# Patient Record
Sex: Female | Born: 1985 | Race: White | Hispanic: No | Marital: Married | Smoking: Former smoker
Health system: Southern US, Community
[De-identification: ages and names within clinical notes are randomized; demographics above are authoritative.]

## PROBLEM LIST (undated history)

## (undated) DIAGNOSIS — Z9889 Other specified postprocedural states: Secondary | ICD-10-CM

## (undated) DIAGNOSIS — E282 Polycystic ovarian syndrome: Secondary | ICD-10-CM

## (undated) DIAGNOSIS — R112 Nausea with vomiting, unspecified: Secondary | ICD-10-CM

## (undated) DIAGNOSIS — K900 Celiac disease: Secondary | ICD-10-CM

## (undated) HISTORY — PX: OTHER SURGICAL HISTORY: SHX169

## (undated) HISTORY — PX: TONSILLECTOMY: SUR1361

## (undated) HISTORY — PX: LEEP: SHX91

---
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
## 2005-10-31 ENCOUNTER — Other Ambulatory Visit: Admission: RE | Admit: 2005-10-31 | Discharge: 2005-10-31 | Payer: Self-pay | Admitting: Obstetrics and Gynecology

## 2006-08-30 ENCOUNTER — Ambulatory Visit (HOSPITAL_COMMUNITY): Admission: RE | Admit: 2006-08-30 | Discharge: 2006-08-30 | Payer: Self-pay | Admitting: Family Medicine

## 2006-10-18 ENCOUNTER — Ambulatory Visit (HOSPITAL_COMMUNITY): Admission: RE | Admit: 2006-10-18 | Discharge: 2006-10-18 | Payer: Self-pay | Admitting: Family Medicine

## 2007-02-21 ENCOUNTER — Ambulatory Visit (HOSPITAL_COMMUNITY): Admission: RE | Admit: 2007-02-21 | Discharge: 2007-02-21 | Payer: Self-pay | Admitting: Obstetrics and Gynecology

## 2007-02-21 ENCOUNTER — Encounter (INDEPENDENT_AMBULATORY_CARE_PROVIDER_SITE_OTHER): Payer: Self-pay | Admitting: Obstetrics and Gynecology

## 2007-02-27 ENCOUNTER — Encounter: Admission: RE | Admit: 2007-02-27 | Discharge: 2007-02-27 | Payer: Self-pay | Admitting: Obstetrics and Gynecology

## 2010-09-17 ENCOUNTER — Encounter: Payer: Self-pay | Admitting: Family Medicine

## 2011-01-09 NOTE — Op Note (Signed)
[name], Angelica Alvarado               ACCOUNT NO.:  [id number]   MEDICAL RECORD NO.:  [id number]          PATIENT TYPE:  AMB   LOCATION:  SDC                           FACILITY:  [facility]   PHYSICIAN:  Michelle L. Grewal, M.D.DATE OF BIRTH:  Mar 07, 1986   DATE OF PROCEDURE:  02/21/2007  DATE OF DISCHARGE:                               OPERATIVE REPORT   PREOPERATIVE DIAGNOSIS:  CIN-2.   POSTOPERATIVE DIAGNOSIS:  CIN-2.   PROCEDURE:  Loop cone biopsy of cervix.   SURGEON:  Dr. Vincente Poli.   ANESTHESIA:  MAC with local.   SPECIMENS:  Ectocervix and endocervix.   ESTIMATED BLOOD LOSS:  Was minimal.   COMPLICATIONS:  None.   PROCEDURE:  Patient was taken to the operating room.  She was given  local without any problems after IV sedation given.  This was done after  she was prepped and draped in usual sterile fashion and speculum had  been inserted in the vagina.  After paracervical block was performed,  Lugol's solution is applied to the cervix.  A LEEP cone biopsy was  performed with an ectocervical margin and then an endocervical hat .  The rollerball was used for hemostasis.  Monsel's applied to the cervix.  No bleeding was noted whatsoever.  All instruments removed from the  vagina.  All sponge, lap and instrument counts were correct x2.  The  patient went to recovery room in stable condition      Michelle L. Vincente Poli, M.D.  Electronically Signed     MLG/MEDQ  D:  02/21/2007  T:  02/21/2007  Job:  [id number]

## 2011-06-13 LAB — CBC
HCT: 38.4
Hemoglobin: 13
MCHC: 33.8
MCV: 83.6
Platelets: 268
RBC: 4.59
RDW: 14
WBC: 7.9

## 2011-06-13 LAB — PREGNANCY, URINE: Preg Test, Ur: NEGATIVE

## 2015-08-31 ENCOUNTER — Other Ambulatory Visit (HOSPITAL_COMMUNITY): Payer: Self-pay

## 2015-09-01 ENCOUNTER — Encounter (HOSPITAL_COMMUNITY): Payer: Self-pay

## 2015-09-01 ENCOUNTER — Encounter (HOSPITAL_COMMUNITY)
Admission: RE | Admit: 2015-09-01 | Discharge: 2015-09-01 | Disposition: A | Discharge status: Home / Self Care | Payer: Managed Care, Other (non HMO) | Source: Ambulatory Visit | Attending: Obstetrics and Gynecology | Admitting: Obstetrics and Gynecology

## 2015-09-01 DIAGNOSIS — Z01818 Encounter for other preprocedural examination: Secondary | ICD-10-CM | POA: Diagnosis present

## 2015-09-01 HISTORY — DX: Other specified postprocedural states: Z98.890

## 2015-09-01 HISTORY — DX: Nausea with vomiting, unspecified: R11.2

## 2015-09-01 LAB — CBC
HCT: 40.9 % (ref 36.0–46.0)
Hemoglobin: 13.6 g/dL (ref 12.0–15.0)
MCH: 29.1 pg (ref 26.0–34.0)
MCHC: 33.3 g/dL (ref 30.0–36.0)
MCV: 87.4 fL (ref 78.0–100.0)
Platelets: 283 10*3/uL (ref 150–400)
RBC: 4.68 MIL/uL (ref 3.87–5.11)
RDW: 14.3 % (ref 11.5–15.5)
WBC: 9.9 10*3/uL (ref 4.0–10.5)

## 2015-09-01 NOTE — Patient Instructions (Signed)
Your procedure is scheduled on:  September 09, 2015    [facility] at: 6:00 am   Pick up the phone at the desk and dial [phone].  Call this number if you have problems the morning of surgery: [phone].  Remember: Do NOT eat food: after midnight on Thursday the 12th  Do NOT drink clear liquids after: midnight on Thursday the 12th  Take these medicines the morning of surgery with a SIP OF WATER: none    Do NOT wear jewelry (body piercing), metal hair clips/bobby pins, make-up, or nail polish. Do NOT wear lotions, powders, or perfumes.  You may wear deoderant. Do NOT shave for 48 hours prior to surgery. Do NOT bring valuables to the hospital. Contacts, dentures, or bridgework may not be worn into surgery. Have a responsible adult drive you home and stay with you for 24 hours after your procedure

## 2015-09-07 NOTE — H&P (Signed)
30 year old G 0 presents complaining of right lower quadrant pain off and on for a while. She was in the office on 12/20 complaining of the pain and a 7 cm simple cyst was noted. Ultrasound on 1/11 showed the cyst to be 7.7 cm.  She desires pregnancy as well and has not conceived with her partner.  Past Medical History  Diagnosis Date  . PONV (postoperative nausea and vomiting)    Past Surgical History  Procedure Laterality Date  . Tonsillectomy    . Tubes in ears     . Leep     No family history on file. Social History   Social History  . Marital Status: Married    Spouse Name: N/A  . Number of Children: N/A  . Years of Education: N/A   Social History Main Topics  . Smoking status: Former Games developermoker  . Smokeless tobacco: Never Used  . Alcohol Use: No  . Drug Use: No  . Sexual Activity: Not on file   Other Topics Concern  . Not on file   Social History Narrative  . No narrative on file   Ros is negative  There were no vitals taken for this visit. Weight 348 pounds  General alert and oriented Lung clear bilaterally Car RRR Abdomen is soft and non tender, obese  IMPRESSION: Right lower quadrant pain Persistent large right ovarian simple cyst Infertility PLAN: Laparoscopy Right ovarian cystectomy versus drainage of ovarian cyst Chromopertubation Risks reviewed  Consent signed

## 2015-09-08 ENCOUNTER — Encounter (HOSPITAL_COMMUNITY): Payer: Self-pay | Admitting: Anesthesiology

## 2015-09-08 MED ORDER — DEXTROSE 5 % IV SOLN
3.0000 g | INTRAVENOUS | Status: AC
  Administered 2015-09-09: 3 g via INTRAVENOUS
  Filled 2015-09-08: qty 3000

## 2015-09-08 NOTE — Anesthesia Preprocedure Evaluation (Addendum)
Anesthesia Evaluation  Patient identified by MRN, date of birth, ID band Patient awake    Reviewed: Allergy & Precautions, NPO status , Patient's Chart, lab work & pertinent test results  History of Anesthesia Complications (+) PONV and history of anesthetic complications  Airway Mallampati: I  TM Distance: >3 FB Neck ROM: Full    Dental no notable dental hx. (+) Teeth Intact   Pulmonary Recent URI , Resolved, former smoker,  Cold 2 weeks ago - resolved    Pulmonary exam normal breath sounds clear to auscultation       Cardiovascular negative cardio ROS Normal cardiovascular exam Rhythm:Regular Rate:Normal     Neuro/Psych negative neurological ROS  negative psych ROS   GI/Hepatic negative GI ROS, Neg liver ROS,   Endo/Other  Morbid obesityPCOS  Renal/GU negative Renal ROS  negative genitourinary   Musculoskeletal negative musculoskeletal ROS (+)   Abdominal (+) + obese,   Peds  Hematology negative hematology ROS (+)   Anesthesia Other Findings   Reproductive/Obstetrics Right Ovarian cyst                            Anesthesia Physical Anesthesia Plan  ASA: III  Anesthesia Plan: General   Post-op Pain Management:    Induction: Intravenous  Airway Management Planned: Oral ETT  Additional Equipment:   Intra-op Plan:   Post-operative Plan: Extubation in OR  Informed Consent: I have reviewed the patients History and Physical, chart, labs and discussed the procedure including the risks, benefits and alternatives for the proposed anesthesia with the patient or authorized representative who has indicated his/her understanding and acceptance.   Dental advisory given  Plan Discussed with: Anesthesiologist, CRNA and Surgeon  Anesthesia Plan Comments:         Anesthesia Quick Evaluation

## 2015-09-09 ENCOUNTER — Ambulatory Visit (HOSPITAL_COMMUNITY)
Admission: RE | Admit: 2015-09-09 | Discharge: 2015-09-09 | Disposition: A | Discharge status: Home / Self Care | Payer: Managed Care, Other (non HMO) | Source: Ambulatory Visit | Attending: Obstetrics and Gynecology | Admitting: Obstetrics and Gynecology

## 2015-09-09 ENCOUNTER — Encounter (HOSPITAL_COMMUNITY)
Admission: RE | Disposition: A | Discharge status: Home / Self Care | Payer: Self-pay | Source: Ambulatory Visit | Attending: Obstetrics and Gynecology

## 2015-09-09 ENCOUNTER — Encounter (HOSPITAL_COMMUNITY): Payer: Self-pay

## 2015-09-09 ENCOUNTER — Ambulatory Visit (HOSPITAL_COMMUNITY): Payer: Managed Care, Other (non HMO) | Admitting: Anesthesiology

## 2015-09-09 DIAGNOSIS — N83201 Unspecified ovarian cyst, right side: Secondary | ICD-10-CM | POA: Diagnosis not present

## 2015-09-09 DIAGNOSIS — Z6841 Body Mass Index (BMI) 40.0 and over, adult: Secondary | ICD-10-CM | POA: Insufficient documentation

## 2015-09-09 DIAGNOSIS — N971 Female infertility of tubal origin: Secondary | ICD-10-CM | POA: Insufficient documentation

## 2015-09-09 HISTORY — PX: LAPAROSCOPY: SHX197

## 2015-09-09 LAB — PREGNANCY, URINE: Preg Test, Ur: NEGATIVE

## 2015-09-09 SURGERY — LAPAROSCOPY, DIAGNOSTIC
Anesthesia: General | Site: Abdomen | Laterality: Right

## 2015-09-09 MED ORDER — ROCURONIUM BROMIDE 100 MG/10ML IV SOLN
INTRAVENOUS | Status: DC | PRN
  Administered 2015-09-09: 30 mg via INTRAVENOUS
  Administered 2015-09-09 (×3): 10 mg via INTRAVENOUS

## 2015-09-09 MED ORDER — SCOPOLAMINE 1 MG/3DAYS TD PT72
MEDICATED_PATCH | TRANSDERMAL | Status: AC
  Administered 2015-09-09: 1.5 mg via TRANSDERMAL
  Filled 2015-09-09: qty 1

## 2015-09-09 MED ORDER — PROPOFOL 10 MG/ML IV BOLUS
INTRAVENOUS | Status: AC
  Filled 2015-09-09: qty 20

## 2015-09-09 MED ORDER — SUCCINYLCHOLINE CHLORIDE 20 MG/ML IJ SOLN
INTRAMUSCULAR | Status: DC | PRN
  Administered 2015-09-09: 120 mg via INTRAVENOUS

## 2015-09-09 MED ORDER — LACTATED RINGERS IV SOLN: INTRAVENOUS | Status: DC

## 2015-09-09 MED ORDER — METHYLENE BLUE 1 % INJ SOLN
INTRAMUSCULAR | Status: AC
  Filled 2015-09-09: qty 1

## 2015-09-09 MED ORDER — PROPOFOL 10 MG/ML IV BOLUS
INTRAVENOUS | Status: DC | PRN
  Administered 2015-09-09: 200 mg via INTRAVENOUS

## 2015-09-09 MED ORDER — BACITRACIN ZINC 500 UNIT/GM EX OINT
TOPICAL_OINTMENT | CUTANEOUS | Status: AC
  Filled 2015-09-09: qty 28.35

## 2015-09-09 MED ORDER — HYDROMORPHONE HCL 1 MG/ML IJ SOLN
INTRAMUSCULAR | Status: AC
  Filled 2015-09-09: qty 1

## 2015-09-09 MED ORDER — HYDROCODONE-ACETAMINOPHEN 7.5-325 MG PO TABS: 1.0000 | ORAL_TABLET | Freq: Once | ORAL | Status: DC | PRN

## 2015-09-09 MED ORDER — SCOPOLAMINE 1 MG/3DAYS TD PT72
1.0000 | MEDICATED_PATCH | Freq: Once | TRANSDERMAL | Status: DC
  Administered 2015-09-09: 1.5 mg via TRANSDERMAL

## 2015-09-09 MED ORDER — METHYLENE BLUE 1 % INJ SOLN
INTRAMUSCULAR | Status: DC | PRN
  Administered 2015-09-09: 1 mL

## 2015-09-09 MED ORDER — MIDAZOLAM HCL 5 MG/5ML IJ SOLN
INTRAMUSCULAR | Status: DC | PRN
  Administered 2015-09-09: 2 mg via INTRAVENOUS

## 2015-09-09 MED ORDER — HYDROMORPHONE HCL 1 MG/ML IJ SOLN
INTRAMUSCULAR | Status: DC | PRN
  Administered 2015-09-09 (×2): 1 mg via INTRAVENOUS

## 2015-09-09 MED ORDER — OXYCODONE-ACETAMINOPHEN 10-325 MG PO TABS: 1.0000 | ORAL_TABLET | ORAL | Status: DC | PRN

## 2015-09-09 MED ORDER — MEPERIDINE HCL 25 MG/ML IJ SOLN: 6.2500 mg | INTRAMUSCULAR | Status: DC | PRN

## 2015-09-09 MED ORDER — SODIUM CHLORIDE 0.9 % IJ SOLN
INTRAMUSCULAR | Status: AC
  Filled 2015-09-09: qty 10

## 2015-09-09 MED ORDER — FENTANYL CITRATE (PF) 250 MCG/5ML IJ SOLN
INTRAMUSCULAR | Status: AC
  Filled 2015-09-09: qty 5

## 2015-09-09 MED ORDER — ONDANSETRON HCL 4 MG/2ML IJ SOLN
INTRAMUSCULAR | Status: DC | PRN
  Administered 2015-09-09: 4 mg via INTRAVENOUS

## 2015-09-09 MED ORDER — SUCCINYLCHOLINE CHLORIDE 20 MG/ML IJ SOLN
INTRAMUSCULAR | Status: AC
  Filled 2015-09-09: qty 1

## 2015-09-09 MED ORDER — NEOSTIGMINE METHYLSULFATE 10 MG/10ML IV SOLN
INTRAVENOUS | Status: AC
  Filled 2015-09-09: qty 1

## 2015-09-09 MED ORDER — KETOROLAC TROMETHAMINE 30 MG/ML IJ SOLN
INTRAMUSCULAR | Status: AC
  Filled 2015-09-09: qty 1

## 2015-09-09 MED ORDER — LACTATED RINGERS IR SOLN
Status: DC | PRN
  Administered 2015-09-09: 150 mL

## 2015-09-09 MED ORDER — FENTANYL CITRATE (PF) 100 MCG/2ML IJ SOLN
INTRAMUSCULAR | Status: AC
  Administered 2015-09-09: 50 ug via INTRAVENOUS
  Filled 2015-09-09: qty 2

## 2015-09-09 MED ORDER — GLYCOPYRROLATE 0.2 MG/ML IJ SOLN
INTRAMUSCULAR | Status: AC
  Filled 2015-09-09: qty 4

## 2015-09-09 MED ORDER — ONDANSETRON HCL 4 MG/2ML IJ SOLN
INTRAMUSCULAR | Status: AC
  Filled 2015-09-09: qty 2

## 2015-09-09 MED ORDER — METOCLOPRAMIDE HCL 5 MG/ML IJ SOLN: 10.0000 mg | Freq: Once | INTRAMUSCULAR | Status: DC | PRN

## 2015-09-09 MED ORDER — FENTANYL CITRATE (PF) 100 MCG/2ML IJ SOLN
INTRAMUSCULAR | Status: AC
  Filled 2015-09-09: qty 2

## 2015-09-09 MED ORDER — FENTANYL CITRATE (PF) 100 MCG/2ML IJ SOLN
25.0000 ug | INTRAMUSCULAR | Status: DC | PRN
  Administered 2015-09-09 (×2): 50 ug via INTRAVENOUS

## 2015-09-09 MED ORDER — FENTANYL CITRATE (PF) 100 MCG/2ML IJ SOLN
INTRAMUSCULAR | Status: DC | PRN
  Administered 2015-09-09: 100 ug via INTRAVENOUS
  Administered 2015-09-09: 150 ug via INTRAVENOUS
  Administered 2015-09-09: 100 ug via INTRAVENOUS

## 2015-09-09 MED ORDER — KETOROLAC TROMETHAMINE 30 MG/ML IJ SOLN
INTRAMUSCULAR | Status: DC | PRN
  Administered 2015-09-09: 30 mg via INTRAVENOUS

## 2015-09-09 MED ORDER — BUPIVACAINE HCL (PF) 0.25 % IJ SOLN
INTRAMUSCULAR | Status: AC
  Filled 2015-09-09: qty 30

## 2015-09-09 MED ORDER — SODIUM CHLORIDE 0.9 % IJ SOLN
INTRAMUSCULAR | Status: DC | PRN
  Administered 2015-09-09: 10 mL

## 2015-09-09 MED ORDER — ROCURONIUM BROMIDE 100 MG/10ML IV SOLN
INTRAVENOUS | Status: AC
  Filled 2015-09-09: qty 1

## 2015-09-09 MED ORDER — LIDOCAINE HCL (CARDIAC) 20 MG/ML IV SOLN
INTRAVENOUS | Status: AC
  Filled 2015-09-09: qty 5

## 2015-09-09 MED ORDER — NEOSTIGMINE METHYLSULFATE 10 MG/10ML IV SOLN
INTRAVENOUS | Status: DC | PRN
  Administered 2015-09-09: 5 mg via INTRAVENOUS

## 2015-09-09 MED ORDER — DEXAMETHASONE SODIUM PHOSPHATE 10 MG/ML IJ SOLN
INTRAMUSCULAR | Status: DC | PRN
  Administered 2015-09-09: 10 mg via INTRAVENOUS

## 2015-09-09 MED ORDER — LACTATED RINGERS IV SOLN
INTRAVENOUS | Status: DC
  Administered 2015-09-09 (×2): via INTRAVENOUS

## 2015-09-09 MED ORDER — DEXAMETHASONE SODIUM PHOSPHATE 10 MG/ML IJ SOLN
INTRAMUSCULAR | Status: AC
  Filled 2015-09-09: qty 1

## 2015-09-09 MED ORDER — GLYCOPYRROLATE 0.2 MG/ML IJ SOLN
INTRAMUSCULAR | Status: DC | PRN
  Administered 2015-09-09: .8 mg via INTRAVENOUS

## 2015-09-09 MED ORDER — LIDOCAINE HCL (CARDIAC) 20 MG/ML IV SOLN
INTRAVENOUS | Status: DC | PRN
  Administered 2015-09-09: 100 mg via INTRAVENOUS

## 2015-09-09 MED ORDER — MIDAZOLAM HCL 2 MG/2ML IJ SOLN
INTRAMUSCULAR | Status: AC
  Filled 2015-09-09: qty 2

## 2015-09-09 MED ORDER — BUPIVACAINE HCL (PF) 0.25 % IJ SOLN
INTRAMUSCULAR | Status: DC | PRN
  Administered 2015-09-09: 3 mL

## 2015-09-09 SURGICAL SUPPLY — 31 items
CABLE HIGH FREQUENCY MONO STRZ (ELECTRODE) ×2 IMPLANT
CATH ROBINSON RED A/P 16FR (CATHETERS) ×2 IMPLANT
CHLORAPREP W/TINT 26ML (MISCELLANEOUS) ×2 IMPLANT
CLOTH BEACON ORANGE TIMEOUT ST (SAFETY) ×2 IMPLANT
DRSG COVADERM PLUS 2X2 (GAUZE/BANDAGES/DRESSINGS) ×4 IMPLANT
DRSG OPSITE POSTOP 3X4 (GAUZE/BANDAGES/DRESSINGS) ×2 IMPLANT
EVACUATOR SMOKE 8.L (FILTER) IMPLANT
GLOVE BIO SURGEON STRL SZ 6.5 (GLOVE) ×1 IMPLANT
GLOVE BIOGEL PI IND STRL 7.0 (GLOVE) ×1 IMPLANT
GLOVE BIOGEL PI INDICATOR 7.0 (GLOVE) ×1
GOWN STRL REUS W/TWL LRG LVL3 (GOWN DISPOSABLE) ×4 IMPLANT
IV STOPCOCK 4 WAY 40  W/Y SET (IV SOLUTION) ×1
IV STOPCOCK 4 WAY 40 W/Y SET (IV SOLUTION) IMPLANT
LIQUID BAND (GAUZE/BANDAGES/DRESSINGS) ×2 IMPLANT
NEEDLE INSUFFLATION 120MM (ENDOMECHANICALS) ×1 IMPLANT
NEEDLE INSUFFLATION 150MM (ENDOMECHANICALS) ×1 IMPLANT
NS IRRIG 1000ML POUR BTL (IV SOLUTION) ×2 IMPLANT
PACK LAPAROSCOPY BASIN (CUSTOM PROCEDURE TRAY) ×2 IMPLANT
PAD TRENDELENBURG POSITION (MISCELLANEOUS) ×2 IMPLANT
SCISSORS LAP 5X35 DISP (ENDOMECHANICALS) ×1 IMPLANT
SET IRRIG TUBING LAPAROSCOPIC (IRRIGATION / IRRIGATOR) IMPLANT
SLEEVE XCEL OPT CAN 5 100 (ENDOMECHANICALS) ×2 IMPLANT
SUT VIC AB 3-0 PS2 18 (SUTURE) ×2
SUT VIC AB 3-0 PS2 18XBRD (SUTURE) IMPLANT
TOWEL OR 17X24 6PK STRL BLUE (TOWEL DISPOSABLE) ×4 IMPLANT
TROCAR 12M 150ML BLUNT (TROCAR) ×1 IMPLANT
TROCAR DILATING TIP 12MM 150MM (ENDOMECHANICALS) ×1 IMPLANT
TROCAR OPTI TIP 5M 100M (ENDOMECHANICALS) IMPLANT
TROCAR XCEL DIL TIP R 11M (ENDOMECHANICALS) ×2 IMPLANT
WARMER LAPAROSCOPE (MISCELLANEOUS) ×2 IMPLANT
WATER STERILE IRR 1000ML POUR (IV SOLUTION) ×2 IMPLANT

## 2015-09-09 NOTE — OR Nursing (Signed)
When pt moved to stretcher post op right armboard fell off of bed and pt right arm hyperextended. Will complete safety zone portal

## 2015-09-09 NOTE — Anesthesia Postprocedure Evaluation (Signed)
Anesthesia Post Note  Patient: Angelica Alvarado  Procedure(s) Performed: Procedure(s) (LRB): DIAGNOSTIC LAPAROSCOPY RIGHT CYST ASPIRATION AND CHROMOPERTUBATION (Right)  Patient location during evaluation: PACU Anesthesia Type: General Level of consciousness: awake and alert and oriented Pain management: pain level controlled Vital Signs Assessment: post-procedure vital signs reviewed and stable Respiratory status: spontaneous breathing, nonlabored ventilation and respiratory function stable Cardiovascular status: blood pressure returned to baseline and stable Postop Assessment: no signs of nausea or vomiting Anesthetic complications: no    Last Vitals:  Filed Vitals:   09/09/15 1045 09/09/15 1052  BP: 114/68 118/63  Pulse: 64 62  Temp: 36.7 C 36.8 C  Resp: 16 14    Last Pain:  Filed Vitals:   09/09/15 1055  PainSc: 3                  Adebayo Ensminger A.

## 2015-09-09 NOTE — Anesthesia Procedure Notes (Signed)
Procedure Name: Intubation Date/Time: 09/09/2015 7:32 AM Performed by: Junious SilkGILBERT, Casson Catena Pre-anesthesia Checklist: Patient identified, Emergency Drugs available, Suction available, Patient being monitored and Timeout performed Patient Re-evaluated:Patient Re-evaluated prior to inductionOxygen Delivery Method: Circle system utilized Preoxygenation: Pre-oxygenation with 100% oxygen Intubation Type: IV induction Ventilation: Mask ventilation without difficulty Laryngoscope Size: Miller and 2 Grade View: Grade I Tube type: Oral Tube size: 7.0 mm Number of attempts: 1 Airway Equipment and Method: Stylet Placement Confirmation: ETT inserted through vocal cords under direct vision,  positive ETCO2,  CO2 detector and breath sounds checked- equal and bilateral Secured at: 21 cm Tube secured with: Tape Dental Injury: Teeth and Oropharynx as per pre-operative assessment

## 2015-09-09 NOTE — Progress Notes (Signed)
H and p on the chart No changes Will proceed with LSC, right ovarian cystectomy and chromopertubation

## 2015-09-09 NOTE — Transfer of Care (Signed)
Immediate Anesthesia Transfer of Care Note  Patient: Angelica Alvarado  Procedure(s) Performed: Procedure(s): DIAGNOSTIC LAPAROSCOPY RIGHT CYST ASPIRATION AND CHROMOPERTUBATION (Right)  Patient Location: PACU  Anesthesia Type:General  Level of Consciousness: awake, alert  and oriented  Airway & Oxygen Therapy: Patient Spontanous Breathing and Patient connected to nasal cannula oxygen  Post-op Assessment: Report given to RN and Post -op Vital signs reviewed and stable  Post vital signs: Reviewed and stable  Last Vitals:  Filed Vitals:   09/09/15 0612  BP: 124/66  Pulse: 74  Temp: 36.8 C  Resp: 20    Complications: No apparent anesthesia complications

## 2015-09-09 NOTE — Discharge Instructions (Signed)

## 2015-09-09 NOTE — Brief Op Note (Signed)
09/09/2015  8:59 AM  PATIENT:  Angelica Alvarado  30 y.o. female  PRE-OPERATIVE DIAGNOSIS: Infertility Right lower quadrant pain Right adnexal mass Morbid obesity  POST-OPERATIVE DIAGNOSIS:   Same Blocked left fallopian tube  PROCEDURE:  Procedure(s): DIAGNOSTIC LAPAROSCOPY RIGHT CYST ASPIRATION AND CHROMOPERTUBATION (Right)  SURGEON:  Surgeon(s) and Role:    * Marcelle OverlieMichelle Modene Andy, MD - Primary    * Candice Campavid Lowe, MD - Assisting  PHYSICIAN ASSISTANT:   ASSISTANTS: none   ANESTHESIA:   general  EBL:  Total I/O In: 1000 [I.V.:1000] Out: 10 [Blood:10]  BLOOD ADMINISTERED:none  DRAINS: none   LOCAL MEDICATIONS USED:  LIDOCAINE   SPECIMEN:  No Specimen  DISPOSITION OF SPECIMEN:  N/A  COUNTS:  YES  TOURNIQUET:  * No tourniquets in log *  DICTATION: .Other Dictation: Dictation Number [id number]  PLAN OF CARE: Discharge to home after PACU  PATIENT DISPOSITION:  PACU - hemodynamically stable.   Delay start of Pharmacological VTE agent (>24hrs) due to surgical blood loss or risk of bleeding: not applicable

## 2015-09-10 NOTE — Op Note (Signed)
NAMFlorina Alvarado:  Alvarado, Angelica              ACCOUNT NO.:  [id number]  MEDICAL RECORD NO.:  [id number]  LOCATION:  [facility]                          FACILITY:  [facility]  PHYSICIAN:  Vendetta Pittinger L. Caira Poche, M.D.DATE OF BIRTH:  1986-04-02  DATE OF PROCEDURE:  09/09/2015 DATE OF DISCHARGE:  09/09/2015                              OPERATIVE REPORT   PREOPERATIVE DIAGNOSIS:  Infertility and right lower quadrant pain and right adnexal mass.  POSTOPERATIVE DIAGNOSIS:  Infertility, blocked left fallopian tube and patent right fallopian tube, simple right ovarian cyst, and morbid obesity.  PROCEDURE:  Diagnostic laparoscopy, chromopertubation, and drainage of right ovarian cyst.  SURGEON:  Mercer Peifer L. Vincente PoliGrewal, MD  ASSISTANT:  Dineen Kidavid C. Rana SnareLowe, MD  ANESTHESIA:  General.  EBL:  Minimal.  COMPLICATIONS:  None.  DRAINS:  None.  PROCEDURE IN DETAIL:  Patient was taken to the operating room  after consent was obtained.  She was then prepped and draped after she was intubated.  An Acorn catheter was inserted and methylene blue was attached with a chromopertubation in standard fashion.  Attention was turned to the abdomen.  This patient was morbidly obese.  We first took towel clamps, placed just beside the umbilicus, made a small incision in the umbilicus, inserted the Veress needle.  We used the long Veress needle because of her morbid obesity.  Pneumoperitoneum was performed with an opening pressure of 15 mm to a maximum of 15.  Insufflation appeared to be going normally.  We removed the Veress needle.  I then initially tried to insert the short trocar, it was not long enough.  I then inserted the long trocar and I could not reach the peritoneum.  At this point, we then converted to the Optiview, and using Optiview, at this point, I asked Dr. Rana SnareLowe to come in to assist me with insertion of the umbilical trocar because of her abdominal obesity.  We inserted the Optiview trocar all the way to the hub, entered  the peritoneum.  I confirmed there was no intestinal injury or bleeding.  We then inserted the pneumoperitoneum.  The patient was then placed in Trendelenburg position.  Dr. Rana SnareLowe then scrubbed out.  I then could visualize her cyst on the right side and it was elevated up out of the pelvis and I could see the ovary was enlarged that appeared to be a simple cyst.  There were no excrescences or adhesions.  I could see too that it was twisted slightly on its pedicle and this was possibly on a case of possible intermittent torsion.  We then inserted a needle and attempted to drain, there was some straw-colored fluid that came out.  I then converted over to some hot scissors, made a much wider incision over the ovarian cyst and drained the cyst completely.  I then could see the remainder of her pelvic organs.  Her uterus appeared normal.  Her right fallopian tube appeared completely normal.  Left ovary appeared normal in size, consistent with PCOS ovaries.  We then did  the chromopertubation.  The right fallopian tube filled and spilled beautifully without any limitation or resistance.  However, after insertion of about 160 mL of methylene blue.  We  could not get any drainage or spillage from the left fallopian tube.  We could see that there appeared to be a slight enlargement kind of in the middle portion of the fallopian tubes, may be there was a blockage there, but definitely the left fallopian tube was not patent.  I did not even see any blue dye go up through the tube at all.  At the end of the procedure, we irrigated all the methylene blue we put in and the cyst fluid.  Hemostasis was very good.  The pneumoperitoneum was released and then the trocar was removed.  The skin stitch was closed with 3-0 interrupted.  All instruments removed from the vagina.  All sponge, lap, and instrument counts were correct x2. Patient went to recovery room in stable condition.     [name] L. Vincente Poli,  M.D.     Florestine Avers  D:  09/09/2015  T:  09/10/2015  Job:  [id number]

## 2015-09-12 ENCOUNTER — Encounter (HOSPITAL_COMMUNITY): Payer: Self-pay | Admitting: Obstetrics and Gynecology

## 2018-10-30 ENCOUNTER — Other Ambulatory Visit: Payer: Self-pay | Admitting: Radiology

## 2018-10-30 DIAGNOSIS — N6310 Unspecified lump in the right breast, unspecified quadrant: Secondary | ICD-10-CM

## 2018-11-04 ENCOUNTER — Ambulatory Visit
Admission: RE | Admit: 2018-11-04 | Discharge: 2018-11-04 | Disposition: A | Discharge status: Home / Self Care | Payer: Managed Care, Other (non HMO) | Source: Ambulatory Visit | Attending: Radiology | Admitting: Radiology

## 2018-11-04 DIAGNOSIS — N6310 Unspecified lump in the right breast, unspecified quadrant: Secondary | ICD-10-CM

## 2020-03-28 ENCOUNTER — Encounter: Payer: Self-pay | Admitting: Emergency Medicine

## 2020-03-28 ENCOUNTER — Other Ambulatory Visit: Payer: Self-pay

## 2020-03-28 ENCOUNTER — Ambulatory Visit
Admission: EM | Admit: 2020-03-28 | Discharge: 2020-03-28 | Disposition: A | Discharge status: Home / Self Care | Payer: Managed Care, Other (non HMO)

## 2020-03-28 DIAGNOSIS — G43009 Migraine without aura, not intractable, without status migrainosus: Secondary | ICD-10-CM | POA: Diagnosis not present

## 2020-03-28 HISTORY — DX: Polycystic ovarian syndrome: E28.2

## 2020-03-28 MED ORDER — KETOROLAC TROMETHAMINE 60 MG/2ML IM SOLN
60.0000 mg | Freq: Once | INTRAMUSCULAR | Status: AC
  Administered 2020-03-28: 60 mg via INTRAMUSCULAR

## 2020-03-28 MED ORDER — SUMATRIPTAN SUCCINATE 6 MG/0.5ML ~~LOC~~ SOLN
6.0000 mg | Freq: Once | SUBCUTANEOUS | Status: AC
  Administered 2020-03-28: 6 mg via SUBCUTANEOUS

## 2020-03-28 MED ORDER — SUMATRIPTAN SUCCINATE 100 MG PO TABS
100.0000 mg | ORAL_TABLET | ORAL | 0 refills | Status: DC | PRN
Start: 2020-03-28 — End: 2020-10-12

## 2020-03-28 MED ORDER — DEXAMETHASONE SODIUM PHOSPHATE 10 MG/ML IJ SOLN
10.0000 mg | Freq: Once | INTRAMUSCULAR | Status: AC
  Administered 2020-03-28: 10 mg via INTRAMUSCULAR

## 2020-03-28 NOTE — ED Triage Notes (Signed)
Migraine on and off x 2 weeks.  Pt has had imitrex in the past and it helped.

## 2020-03-28 NOTE — Discharge Instructions (Signed)
Migraine cocktail given in office Rest and drink plenty of fluids Use OTC medications as needed for symptomatic relief Imitrex prescribed.  Take as directed Follow up with PCP if symptoms persists Return or go to the ER if you have any new or worsening symptoms such as fever, chills, nausea, vomiting, chest pain, shortness of breath, cough, vision changes, worsening headache despite treatment, slurred speech, facial asymmetry, weakness in arms or legs, etc..Marland Kitchen

## 2020-03-28 NOTE — ED Provider Notes (Signed)
[facility]   [id number] 03/28/20 Arrival Time: 1316  [id number]  SUBJECTIVE:  Angelica Alvarado is a 34 y.o. female who complains of intermittent daily migraines x 2 weeks.  Denies a precipitating event, or recent head trauma.  Patient localizes her pain to the behind eyes.  Describes the pain as constant and throbbing in character.  Patient has tried OTC ibuprofen and tylenol without relief. Symptoms are made worse with light.  Reports similar symptoms in the past that improved with toradol shot and Imitrex.  This is not the worst headache of their life.  Patient denies fever, chills, vomiting, aura, rhinorrhea, watery eyes, chest pain, SOB, abdominal pain, weakness, numbness or tingling, slurred speech.     ROS: As per HPI.  All other pertinent ROS negative.     Past Medical History:  Diagnosis Date  . Polycystic ovarian disease   . PONV (postoperative nausea and vomiting)    Past Surgical History:  Procedure Laterality Date  . LAPAROSCOPY Right 09/09/2015   Procedure: DIAGNOSTIC LAPAROSCOPY RIGHT CYST ASPIRATION AND CHROMOPERTUBATION;  Surgeon: Marcelle Overlie, MD;  Location: [facility];  Service: Gynecology;  Laterality: Right;  . LEEP    . TONSILLECTOMY    . tubes in ears      No Known Allergies No current facility-administered medications on file prior to encounter.   Current Outpatient Medications on File Prior to Encounter  Medication Sig Dispense Refill  . medroxyPROGESTERone (PROVERA) 10 MG tablet Take 5 mg by mouth daily. Take 2 tablets once daily for 14 days.     . metFORMIN (GLUCOPHAGE-XR) 500 MG 24 hr tablet Take 1,000 mg by mouth in the morning and at bedtime.    Marland Kitchen spironolactone (ALDACTONE) 25 MG tablet Take by mouth daily. Pt does not know dose     Social History   Socioeconomic History  . Marital status: Married    Spouse name: Not on file  . Number of children: Not on file  . Years of education: Not on file  . Highest education level: Not on  file  Occupational History  . Not on file  Tobacco Use  . Smoking status: Former Games developer  . Smokeless tobacco: Never Used  Substance and Sexual Activity  . Alcohol use: No  . Drug use: No  . Sexual activity: Yes    Birth control/protection: None  Other Topics Concern  . Not on file  Social History Narrative  . Not on file   Social Determinants of Health   Financial Resource Strain:   . Difficulty of Paying Living Expenses:   Food Insecurity:   . Worried About Programme researcher, broadcasting/film/video in the Last Year:   . Barista in the Last Year:   Transportation Needs:   . Freight forwarder (Medical):   Marland Kitchen Lack of Transportation (Non-Medical):   Physical Activity:   . Days of Exercise per Week:   . Minutes of Exercise per Session:   Stress:   . Feeling of Stress :   Social Connections:   . Frequency of Communication with Friends and Family:   . Frequency of Social Gatherings with Friends and Family:   . Attends Religious Services:   . Active Member of Clubs or Organizations:   . Attends Banker Meetings:   Marland Kitchen Marital Status:   Intimate Partner Violence:   . Fear of Current or Ex-Partner:   . Emotionally Abused:   Marland Kitchen Physically Abused:   . Sexually Abused:  Family History  Problem Relation Age of Onset  . Breast cancer Paternal Grandmother     OBJECTIVE:  Vitals:   03/28/20 1327 03/28/20 1332  BP:  (!) 129/92  Pulse:  67  Resp:  19  Temp:  98.3 F (36.8 C)  TempSrc:  Oral  SpO2:  97%  Weight: (!) 393 lb (178.3 kg)   Height: 5\' 3"  (1.6 m)     General appearance: alert; no distress Eyes: PERRLA; EOMI HENT: normocephalic; atraumatic Neck: supple with FROM Lungs: clear to auscultation bilaterally Heart: regular rate and rhythm.   Extremities: no edema; symmetrical with no gross deformities Skin: warm and dry Neurologic: CN 2-12 grossly intact; finger to nose without difficulty; normal gait; strength and sensation intact bilaterally about the upper  and lower extremities; negative pronator drift Psychological: alert and cooperative; normal mood and affect   ASSESSMENT & PLAN:  1. Migraine without aura and without status migrainosus, not intractable     Meds ordered this encounter  Medications  . SUMAtriptan (IMITREX) 100 MG tablet    Sig: Take 1 tablet (100 mg total) by mouth every 2 (two) hours as needed for migraine. May repeat in 2 hours if headache persists or recurs.    Dispense:  10 tablet    Refill:  0    Order Specific Question:   Supervising Provider    Answer:   Eustace Moore  . dexamethasone (DECADRON) injection 10 mg  . SUMAtriptan (IMITREX) injection 6 mg  . ketorolac (TORADOL) injection 60 mg    Migraine cocktail given in office Rest and drink plenty of fluids Use OTC medications as needed for symptomatic relief Imitrex prescribed.  Take as directed Follow up with PCP if symptoms persists Return or go to the ER if you have any new or worsening symptoms such as fever, chills, nausea, vomiting, chest pain, shortness of breath, cough, vision changes, worsening headache despite treatment, slurred speech, facial asymmetry, weakness in arms or legs, etc...  Reviewed expectations re: course of current medical issues. Questions answered. Outlined signs and symptoms indicating need for more acute intervention. Patient verbalized understanding. After Visit Summary given.   [[id number]], PA-C 03/28/20 1357

## 2020-06-01 ENCOUNTER — Encounter: Payer: Self-pay | Admitting: Internal Medicine

## 2020-06-08 ENCOUNTER — Ambulatory Visit: Payer: Managed Care, Other (non HMO) | Admitting: Gastroenterology

## 2020-07-12 ENCOUNTER — Ambulatory Visit: Payer: Managed Care, Other (non HMO) | Admitting: Gastroenterology

## 2020-08-23 ENCOUNTER — Other Ambulatory Visit: Payer: Self-pay | Admitting: Gastroenterology

## 2020-08-23 ENCOUNTER — Other Ambulatory Visit: Payer: Self-pay | Admitting: Physician Assistant

## 2020-08-23 DIAGNOSIS — R1013 Epigastric pain: Secondary | ICD-10-CM

## 2020-08-25 ENCOUNTER — Encounter: Payer: Self-pay | Admitting: Emergency Medicine

## 2020-08-25 ENCOUNTER — Ambulatory Visit
Admission: EM | Admit: 2020-08-25 | Discharge: 2020-08-25 | Disposition: A | Discharge status: Home / Self Care | Payer: Managed Care, Other (non HMO) | Attending: Family Medicine | Admitting: Family Medicine

## 2020-08-25 DIAGNOSIS — K122 Cellulitis and abscess of mouth: Secondary | ICD-10-CM

## 2020-08-25 DIAGNOSIS — R059 Cough, unspecified: Secondary | ICD-10-CM

## 2020-08-25 DIAGNOSIS — R519 Headache, unspecified: Secondary | ICD-10-CM

## 2020-08-25 DIAGNOSIS — H9202 Otalgia, left ear: Secondary | ICD-10-CM

## 2020-08-25 DIAGNOSIS — J029 Acute pharyngitis, unspecified: Secondary | ICD-10-CM

## 2020-08-25 DIAGNOSIS — H66002 Acute suppurative otitis media without spontaneous rupture of ear drum, left ear: Secondary | ICD-10-CM | POA: Diagnosis not present

## 2020-08-25 MED ORDER — AMOXICILLIN-POT CLAVULANATE 875-125 MG PO TABS: 1.0000 | ORAL_TABLET | Freq: Two times a day (BID) | ORAL | 0 refills | Status: AC

## 2020-08-25 MED ORDER — PROMETHAZINE-DM 6.25-15 MG/5ML PO SYRP: 5.0000 mL | ORAL_SOLUTION | Freq: Four times a day (QID) | ORAL | 0 refills | Status: DC | PRN

## 2020-08-25 MED ORDER — DEXAMETHASONE SODIUM PHOSPHATE 10 MG/ML IJ SOLN
10.0000 mg | Freq: Once | INTRAMUSCULAR | Status: AC
  Administered 2020-08-25: 10 mg via INTRAMUSCULAR

## 2020-08-25 MED ORDER — PROMETHAZINE-DM 6.25-15 MG/5ML PO SYRP
5.0000 mL | ORAL_SOLUTION | Freq: Four times a day (QID) | ORAL | 0 refills | Status: DC | PRN
Start: 2020-08-25 — End: 2020-08-25

## 2020-08-25 NOTE — ED Triage Notes (Signed)
Stuffy nose, sore throat and sinus pressure that started 08/23/2020.

## 2020-08-25 NOTE — Discharge Instructions (Signed)
I have sent in cough syrup for you to take. This medication can make you sleepy. Do not drive while taking this medication.  I have sent in Augmentin for you to take twice a day for 7 days.  You have received a steroid injection in the office today   Your COVID and Flu tests are pending.  You should self quarantine until the test results are back.    Take Tylenol or ibuprofen as needed for fever or discomfort.  Rest and keep yourself hydrated.    Follow-up with your primary care provider if your symptoms are not improving.

## 2020-08-25 NOTE — ED Provider Notes (Signed)
[facility]   [id number] 08/25/20 Arrival Time: 1415   CC: COVID symptoms  SUBJECTIVE: History from: patient.  Angelica Alvarado is a 34 y.o. female who presents with abrupt onset of nasal congestion, PND, sore throat, fatigue, chills, lymphadenopathy, cough, and left ear pain for the last 2 days. Denies sick exposure to COVID, flu or strep. Denies recent travel. Has negative history of Covid. Has completed Covid vaccines. Not yet eligible for Covid booster. Has not received flu vaccine this year. Has taken OTC cough and cold medications for this with little relief. Sore throat is made worse with swallowing. Tolerating own secretions well. There are no alleviating factors. Denies previous symptoms in the past. Denies fever, sinus pain, SOB, wheezing, chest pain, nausea, changes in bowel or bladder habits.    ROS: As per HPI.  All other pertinent ROS negative.     Past Medical History:  Diagnosis Date  . Polycystic ovarian disease   . PONV (postoperative nausea and vomiting)    Past Surgical History:  Procedure Laterality Date  . LAPAROSCOPY Right 09/09/2015   Procedure: DIAGNOSTIC LAPAROSCOPY RIGHT CYST ASPIRATION AND CHROMOPERTUBATION;  Surgeon: Marcelle Overlie, MD;  Location: [facility];  Service: Gynecology;  Laterality: Right;  . LEEP    . TONSILLECTOMY    . tubes in ears      No Known Allergies No current facility-administered medications on file prior to encounter.   Current Outpatient Medications on File Prior to Encounter  Medication Sig Dispense Refill  . medroxyPROGESTERone (PROVERA) 10 MG tablet Take 5 mg by mouth daily. Take 2 tablets once daily for 14 days.     . metFORMIN (GLUCOPHAGE-XR) 500 MG 24 hr tablet Take 1,000 mg by mouth in the morning and at bedtime.    Marland Kitchen spironolactone (ALDACTONE) 25 MG tablet Take by mouth daily. Pt does not know dose    . SUMAtriptan (IMITREX) 100 MG tablet Take 1 tablet (100 mg total) by mouth every 2 (two) hours as needed  for migraine. May repeat in 2 hours if headache persists or recurs. 10 tablet 0   Social History   Socioeconomic History  . Marital status: Married    Spouse name: Not on file  . Number of children: Not on file  . Years of education: Not on file  . Highest education level: Not on file  Occupational History  . Not on file  Tobacco Use  . Smoking status: Former Games developer  . Smokeless tobacco: Never Used  Substance and Sexual Activity  . Alcohol use: Yes    Comment: occ  . Drug use: No  . Sexual activity: Yes    Birth control/protection: None  Other Topics Concern  . Not on file  Social History Narrative  . Not on file   Social Determinants of Health   Financial Resource Strain: Not on file  Food Insecurity: Not on file  Transportation Needs: Not on file  Physical Activity: Not on file  Stress: Not on file  Social Connections: Not on file  Intimate Partner Violence: Not on file   Family History  Problem Relation Age of Onset  . Breast cancer Paternal Grandmother     OBJECTIVE:  Vitals:   08/25/20 1418  BP: (!) 137/99  Pulse: 78  Resp: 19  Temp: (!) 97.2 F (36.2 C)  TempSrc: Oral  SpO2: 97%     General appearance: alert; appears fatigued, but nontoxic; speaking in full sentences and tolerating own secretions HEENT: NCAT; Ears: EACs  clear, R TM pearly gray, L TM erythematous, bulging, with effusion; Eyes: PERRL.  EOM grossly intact. Sinuses: mild tenderness to left maxillary sinus; Nose: nares patent without rhinorrhea, Throat: oropharynx erythematous, cobblestoning present, tonsils surgically absent, uvula erythematous and swollen Neck: supple with left sided LAD, visibly swollen to L side Lungs: unlabored respirations, symmetrical air entry; cough: mild; no respiratory distress; CTAB Heart: regular rate and rhythm.  Radial pulses 2+ symmetrical bilaterally Skin: warm and dry Psychological: alert and cooperative; normal mood and affect  LABS:  No results  found for this or any previous visit (from the past 24 hour(s)).   ASSESSMENT & PLAN:  1. Non-recurrent acute suppurative otitis media of left ear without spontaneous rupture of tympanic membrane   2. Sore throat   3. Acute otalgia, left   4. Uvulitis   5. Nonintractable headache, unspecified chronicity pattern, unspecified headache type   6. Cough     Meds ordered this encounter  Medications  . amoxicillin-clavulanate (AUGMENTIN) 875-125 MG tablet    Sig: Take 1 tablet by mouth 2 (two) times daily for 7 days.    Dispense:  14 tablet    Refill:  0    Order Specific Question:   Supervising Provider    Answer:   Merrilee Jansky [id number]  . dexamethasone (DECADRON) injection 10 mg  . DISCONTD: promethazine-dextromethorphan (PROMETHAZINE-DM) 6.25-15 MG/5ML syrup    Sig: Take 5 mLs by mouth 4 (four) times daily as needed for cough.    Dispense:  118 mL    Refill:  0    Order Specific Question:   Supervising Provider    Answer:   Merrilee Jansky [id number]  . promethazine-dextromethorphan (PROMETHAZINE-DM) 6.25-15 MG/5ML syrup    Sig: Take 5 mLs by mouth 4 (four) times daily as needed for cough.    Dispense:  118 mL    Refill:  0    Order Specific Question:   Supervising Provider    Answer:   Merrilee Jansky [id number]   Decadron 10mg  IM in office today Prescribed Augmentin BID x 7 days Prescribed promethazine cough syrup Sedation precautions given  Continue supportive care at home COVID and flu testing ordered.  It will take between 1-2 days for test results.  Someone will contact you regarding abnormal results.   Work note provided Patient should remain in quarantine until they have received Covid results.  If negative you may resume normal activities (go back to work/school) while practicing hand hygiene, social distance, and mask wearing.  If positive, patient should remain in quarantine for 10 days from symptom onset AND greater than 72 hours after symptoms resolution  (absence of fever without the use of fever-reducing medication and improvement in respiratory symptoms), whichever is longer Get plenty of rest and push fluids Use OTC zyrtec for nasal congestion, runny nose, and/or sore throat Use OTC flonase for nasal congestion and runny nose Use medications daily for symptom relief Use OTC medications like ibuprofen or tylenol as needed fever or pain Call or go to the ED if you have any new or worsening symptoms such as fever, worsening cough, shortness of breath, chest tightness, chest pain, turning blue, changes in mental status.  Reviewed expectations re: course of current medical issues. Questions answered. Outlined signs and symptoms indicating need for more acute intervention. Patient verbalized understanding. After Visit Summary given.         , NP 08/27/20 [phone]

## 2020-08-27 LAB — COVID-19, FLU A+B NAA
Influenza A, NAA: NOT DETECTED
Influenza B, NAA: NOT DETECTED
SARS-CoV-2, NAA: NOT DETECTED

## 2020-09-13 ENCOUNTER — Other Ambulatory Visit: Payer: Self-pay

## 2020-09-13 ENCOUNTER — Other Ambulatory Visit: Payer: Managed Care, Other (non HMO)

## 2020-09-13 ENCOUNTER — Ambulatory Visit
Admission: EM | Admit: 2020-09-13 | Discharge: 2020-09-13 | Disposition: A | Discharge status: Home / Self Care | Payer: Managed Care, Other (non HMO) | Attending: Family Medicine | Admitting: Family Medicine

## 2020-09-13 ENCOUNTER — Encounter: Payer: Self-pay | Admitting: Emergency Medicine

## 2020-09-13 DIAGNOSIS — Z20822 Contact with and (suspected) exposure to covid-19: Secondary | ICD-10-CM | POA: Diagnosis not present

## 2020-09-13 NOTE — ED Triage Notes (Signed)
Cough started yesterday, runny nose, headache, body aches

## 2020-09-14 ENCOUNTER — Other Ambulatory Visit: Payer: Managed Care, Other (non HMO)

## 2020-09-15 LAB — COVID-19, FLU A+B NAA
Influenza A, NAA: NOT DETECTED
Influenza B, NAA: NOT DETECTED
SARS-CoV-2, NAA: DETECTED — AB

## 2020-10-05 ENCOUNTER — Other Ambulatory Visit: Payer: Self-pay

## 2020-10-05 ENCOUNTER — Encounter (HOSPITAL_COMMUNITY): Payer: Self-pay | Admitting: Gastroenterology

## 2020-10-12 ENCOUNTER — Encounter (HOSPITAL_COMMUNITY): Payer: Self-pay | Admitting: Gastroenterology

## 2020-10-12 ENCOUNTER — Ambulatory Visit (HOSPITAL_COMMUNITY): Payer: Managed Care, Other (non HMO) | Admitting: Anesthesiology

## 2020-10-12 ENCOUNTER — Other Ambulatory Visit: Payer: Self-pay

## 2020-10-12 ENCOUNTER — Ambulatory Visit (HOSPITAL_COMMUNITY)
Admission: RE | Admit: 2020-10-12 | Discharge: 2020-10-12 | Disposition: A | Discharge status: Home / Self Care | Payer: Managed Care, Other (non HMO) | Attending: Gastroenterology | Admitting: Gastroenterology

## 2020-10-12 ENCOUNTER — Encounter (HOSPITAL_COMMUNITY)
Admission: RE | Disposition: A | Discharge status: Home / Self Care | Payer: Self-pay | Source: Home / Self Care | Attending: Gastroenterology

## 2020-10-12 DIAGNOSIS — K644 Residual hemorrhoidal skin tags: Secondary | ICD-10-CM | POA: Insufficient documentation

## 2020-10-12 DIAGNOSIS — R197 Diarrhea, unspecified: Secondary | ICD-10-CM | POA: Diagnosis not present

## 2020-10-12 DIAGNOSIS — K648 Other hemorrhoids: Secondary | ICD-10-CM | POA: Insufficient documentation

## 2020-10-12 DIAGNOSIS — R768 Other specified abnormal immunological findings in serum: Secondary | ICD-10-CM | POA: Insufficient documentation

## 2020-10-12 DIAGNOSIS — K2950 Unspecified chronic gastritis without bleeding: Secondary | ICD-10-CM | POA: Insufficient documentation

## 2020-10-12 DIAGNOSIS — K921 Melena: Secondary | ICD-10-CM | POA: Diagnosis present

## 2020-10-12 DIAGNOSIS — Z79899 Other long term (current) drug therapy: Secondary | ICD-10-CM | POA: Diagnosis not present

## 2020-10-12 DIAGNOSIS — Z87891 Personal history of nicotine dependence: Secondary | ICD-10-CM | POA: Diagnosis not present

## 2020-10-12 DIAGNOSIS — Z803 Family history of malignant neoplasm of breast: Secondary | ICD-10-CM | POA: Insufficient documentation

## 2020-10-12 DIAGNOSIS — R1084 Generalized abdominal pain: Secondary | ICD-10-CM | POA: Diagnosis present

## 2020-10-12 DIAGNOSIS — K2980 Duodenitis without bleeding: Secondary | ICD-10-CM | POA: Diagnosis not present

## 2020-10-12 DIAGNOSIS — K5730 Diverticulosis of large intestine without perforation or abscess without bleeding: Secondary | ICD-10-CM | POA: Insufficient documentation

## 2020-10-12 HISTORY — PX: COLONOSCOPY WITH PROPOFOL: SHX5780

## 2020-10-12 HISTORY — PX: ESOPHAGOGASTRODUODENOSCOPY (EGD) WITH PROPOFOL: SHX5813

## 2020-10-12 HISTORY — PX: BIOPSY: SHX5522

## 2020-10-12 LAB — PREGNANCY, URINE: Preg Test, Ur: NEGATIVE

## 2020-10-12 SURGERY — COLONOSCOPY WITH PROPOFOL
Anesthesia: Monitor Anesthesia Care

## 2020-10-12 MED ORDER — SODIUM CHLORIDE 0.9 % IV SOLN: INTRAVENOUS | Status: DC

## 2020-10-12 MED ORDER — PROPOFOL 500 MG/50ML IV EMUL
INTRAVENOUS | Status: AC
  Filled 2020-10-12: qty 200

## 2020-10-12 MED ORDER — LACTATED RINGERS IV SOLN: INTRAVENOUS | Status: DC | PRN

## 2020-10-12 MED ORDER — LIDOCAINE 2% (20 MG/ML) 5 ML SYRINGE
INTRAMUSCULAR | Status: DC | PRN
  Administered 2020-10-12: 80 mg via INTRAVENOUS

## 2020-10-12 MED ORDER — PROPOFOL 10 MG/ML IV BOLUS
INTRAVENOUS | Status: AC
  Filled 2020-10-12: qty 20

## 2020-10-12 MED ORDER — PROPOFOL 500 MG/50ML IV EMUL
INTRAVENOUS | Status: DC | PRN
  Administered 2020-10-12: 150 ug/kg/min via INTRAVENOUS

## 2020-10-12 MED ORDER — PROPOFOL 500 MG/50ML IV EMUL
INTRAVENOUS | Status: AC
  Filled 2020-10-12: qty 50

## 2020-10-12 SURGICAL SUPPLY — 25 items

## 2020-10-12 NOTE — Discharge Instructions (Signed)

## 2020-10-12 NOTE — Transfer of Care (Signed)
Immediate Anesthesia Transfer of Care Note  Patient: Angelica Alvarado  Procedure(s) Performed: COLONOSCOPY WITH PROPOFOL (N/A ) ESOPHAGOGASTRODUODENOSCOPY (EGD) WITH PROPOFOL (N/A ) BIOPSY  Patient Location: Endoscopy Unit  Anesthesia Type:MAC  Level of Consciousness: awake, alert , oriented and patient cooperative  Airway & Oxygen Therapy: Patient Spontanous Breathing and Patient connected to face mask oxygen  Post-op Assessment: Report given to RN, Post -op Vital signs reviewed and stable and Patient moving all extremities  Post vital signs: Reviewed and stable  Last Vitals:  Vitals Value Taken Time  BP    Temp    Pulse 99 10/12/20 1100  Resp 19 10/12/20 1100  SpO2 96 % 10/12/20 1100  Vitals shown include unvalidated device data.  Last Pain:  Vitals:   10/12/20 0859  TempSrc: Oral  PainSc: 0-No pain         Complications: No complications documented.

## 2020-10-12 NOTE — Anesthesia Postprocedure Evaluation (Signed)
Anesthesia Post Note  Patient: Angelica Alvarado  Procedure(s) Performed: COLONOSCOPY WITH PROPOFOL (N/A ) ESOPHAGOGASTRODUODENOSCOPY (EGD) WITH PROPOFOL (N/A ) BIOPSY     Patient location during evaluation: PACU Anesthesia Type: MAC Level of consciousness: awake and alert Pain management: pain level controlled Vital Signs Assessment: post-procedure vital signs reviewed and stable Respiratory status: spontaneous breathing, nonlabored ventilation and respiratory function stable Cardiovascular status: blood pressure returned to baseline and stable Postop Assessment: no apparent nausea or vomiting Anesthetic complications: no   No complications documented.  Last Vitals:  Vitals:   10/12/20 1100 10/12/20 1110  BP: 138/72 (!) 149/79  Pulse: 99 90  Resp: 19 18  Temp:    SpO2: 96% 95%    Last Pain:  Vitals:   10/12/20 1058  TempSrc: Oral  PainSc: 0-No pain                 Pervis Hocking

## 2020-10-12 NOTE — Op Note (Signed)
[facility] Patient Name: Angelica Alvarado Procedure Date: 10/12/2020 MRN: [id number] Attending MD: Willis Modena , MD Date of Birth: 01-19-1986 CSN: [id number] Age: 35 Admit Type: Outpatient Procedure:                Colonoscopy Indications:              This is the patient's first colonoscopy, Clinically                            significant diarrhea of unexplained origin,                            Hematochezia Providers:                Willis Modena, MD, Dwain Sarna, RN, Beryle Beams, Technician, Dionne Bucy, CRNA Referring MD:             Catalina Pizza, MD Medicines:                Monitored Anesthesia Care Complications:            No immediate complications. Estimated Blood Loss:     Estimated blood loss: none. Procedure:                Pre-Anesthesia Assessment:                           - Prior to the procedure, a History and Physical                            was performed, and patient medications and                            allergies were reviewed. The patient's tolerance of                            previous anesthesia was also reviewed. The risks                            and benefits of the procedure and the sedation                            options and risks were discussed with the patient.                            All questions were answered, and informed consent                            was obtained. Prior Anticoagulants: The patient has                            taken no previous anticoagulant or antiplatelet                            agents. ASA  Grade Assessment: III - A patient with                            severe systemic disease. After reviewing the risks                            and benefits, the patient was deemed in                            satisfactory condition to undergo the procedure.                           After obtaining informed consent, the colonoscope                             was passed under direct vision. Throughout the                            procedure, the patient's blood pressure, pulse, and                            oxygen saturations were monitored continuously. The                            CF-HQ190L ([id number]) Olympus colonoscope was                            introduced through the anus and advanced to the the                            cecum, identified by appendiceal orifice and                            ileocecal valve. The ileocecal valve, appendiceal                            orifice, and rectum were photographed. The entire                            colon was examined. The colonoscopy was performed                            without difficulty. The patient tolerated the                            procedure well. The quality of the bowel                            preparation was good. Scope In: 10:38:16 AM Scope Out: 10:50:13 AM Scope Withdrawal Time: 0 hours 9 minutes 24 seconds  Total Procedure Duration: 0 hours 11 minutes 57 seconds  Findings:      Hemorrhoids were found on perianal exam.      Internal hemorrhoids were found during retroflexion. The hemorrhoids  were mild.      No additional abnormalities were found on retroflexion.      A few medium-mouthed diverticula were found in the sigmoid colon and       descending colon.      A diffuse area of mildly nodular mucosa was found in the rectum, in the       sigmoid colon, in the descending colon, in the transverse colon and in       the ascending colon. Biopsies were taken with a cold forceps for       histology.      Colon otherwise normal; no other polyps, masses, vascular ectasias, or       inflammatory changes were seen. Impression:               - Hemorrhoids found on perianal exam.                           - Internal hemorrhoids.                           - Diverticulosis in the sigmoid colon and in the                            descending colon.                            - Nodular mucosa in the rectum, in the sigmoid                            colon, in the descending colon, in the transverse                            colon and in the ascending colon. Biopsied.                           - The examination was otherwise normal. Moderate Sedation:      Not Applicable - Patient had care per Anesthesia. Recommendation:           - Patient has a contact number available for                            emergencies. The signs and symptoms of potential                            delayed complications were discussed with the                            patient. Return to normal activities tomorrow.                            Written discharge instructions were provided to the                            patient.                           - Discharge patient  to home (ambulatory).                           - High fiber diet indefinitely.                           - Topical therapies (e.g., Preparation-H),                            avoidance of constipation/straining, liberal water                            intake, for management of hemorrhoids.                           - Continue present medications.                           - Await pathology results.                           - Repeat colonoscopy (date not yet determined) for                            surveillance based on pathology results.                           - Return to GI clinic after studies are complete.                           - Return to referring physician as previously                            scheduled. Procedure Code(s):        --- Professional ---                           [phone], Colonoscopy, flexible; with biopsy, single                            or multiple Diagnosis Code(s):        --- Professional ---                           K64.8, Other hemorrhoids                           K62.89, Other specified diseases of anus and rectum                           K63.89, Other  specified diseases of intestine                           R19.7, Diarrhea, unspecified                           K92.1, Melena (includes Hematochezia)  K57.30, Diverticulosis of large intestine without                            perforation or abscess without bleeding CPT copyright 2019 American Medical Association. All rights reserved. The codes documented in this report are preliminary and upon coder review may  be revised to meet current compliance requirements. Willis Modena, MD 10/12/2020 10:59:10 AM This report has been signed electronically. Number of Addenda: 0

## 2020-10-12 NOTE — Op Note (Signed)
[facility] Patient Name: Angelica Alvarado Procedure Date: 10/12/2020 MRN: [id number] Attending MD: Willis Modena , MD Date of Birth: 1985-10-17 CSN: [id number] Age: 35 Admit Type: Outpatient Procedure:                Upper GI endoscopy Indications:              Generalized abdominal pain, Positive celiac                            serologies, Diarrhea Providers:                Willis Modena, MD, Dwain Sarna, RN, Beryle Beams, Technician, Dionne Bucy, CRNA Referring MD:              Medicines:                Monitored Anesthesia Care Complications:            No immediate complications. Estimated Blood Loss:     Estimated blood loss: none. Procedure:                Pre-Anesthesia Assessment:                           - Prior to the procedure, a History and Physical                            was performed, and patient medications and                            allergies were reviewed. The patient's tolerance of                            previous anesthesia was also reviewed. The risks                            and benefits of the procedure and the sedation                            options and risks were discussed with the patient.                            All questions were answered, and informed consent                            was obtained. Prior Anticoagulants: The patient has                            taken no previous anticoagulant or antiplatelet                            agents. ASA Grade Assessment: III - A patient with  severe systemic disease. After reviewing the risks                            and benefits, the patient was deemed in                            satisfactory condition to undergo the procedure.                           After obtaining informed consent, the endoscope was                            passed under direct vision. Throughout the                             procedure, the patient's blood pressure, pulse, and                            oxygen saturations were monitored continuously. The                            [id number]-[id number] ([id number]) Olympus gastroscope was                            introduced through the mouth, and advanced to the                            second part of duodenum. The upper GI endoscopy was                            accomplished without difficulty. The patient                            tolerated the procedure well. Scope In: Scope Out: Findings:      The examined esophagus was normal.      Patchy mild inflammation was found in the gastric fundus, in the gastric       body and in the gastric antrum. Biopsies were taken with a cold forceps       for histology.      The exam of the stomach was otherwise normal.      The duodenal bulb, first portion of the duodenum and second portion of       the duodenum were normal. Biopsies for histology were taken with a cold       forceps for evaluation of celiac disease. Impression:               - Normal esophagus.                           - Gastritis. Biopsied.                           - Normal duodenal bulb, first portion of the  duodenum and second portion of the duodenum.                            Biopsied. Moderate Sedation:      None Recommendation:           - Await pathology results.                           - Perform a colonoscopy today. Procedure Code(s):        --- Professional ---                           [phone], Esophagogastroduodenoscopy, flexible,                            transoral; with biopsy, single or multiple Diagnosis Code(s):        --- Professional ---                           K29.70, Gastritis, unspecified, without bleeding                           R10.84, Generalized abdominal pain                           R76.8, Other specified abnormal immunological                            findings in serum                            R19.7, Diarrhea, unspecified CPT copyright 2019 American Medical Association. All rights reserved. The codes documented in this report are preliminary and upon coder review may  be revised to meet current compliance requirements. Willis Modena, MD 10/12/2020 10:55:17 AM This report has been signed electronically. Number of Addenda: 0

## 2020-10-12 NOTE — H&P (Signed)
[facility] Admission Note  Chief Complaint: diarrhea, blood in stool  HPI: Angelica Alvarado is an 35 y.o. female.  Evaluation blood in stool, diarrhea, positive celiac serology.  Past Medical History:  Diagnosis Date  . Polycystic ovarian disease   . PONV (postoperative nausea and vomiting)     Past Surgical History:  Procedure Laterality Date  . LAPAROSCOPY Right 09/09/2015   Procedure: DIAGNOSTIC LAPAROSCOPY RIGHT CYST ASPIRATION AND CHROMOPERTUBATION;  Surgeon: Marcelle Overlie, MD;  Location: [facility];  Service: Gynecology;  Laterality: Right;  . LEEP    . TONSILLECTOMY    . tubes in ears       Medications Prior to Admission  Medication Sig Dispense Refill  . acetaminophen (TYLENOL) 500 MG tablet Take 500 mg by mouth every 6 (six) hours as needed (pain).    Marland Kitchen amphetamine-dextroamphetamine (ADDERALL) 10 MG tablet Take 10 mg by mouth daily as needed (attention).    . Cholecalciferol (VITAMIN D-3) 125 MCG (5000 UT) TABS Take 5,000 Units by mouth every evening.    Marland Kitchen ibuprofen (ADVIL) 200 MG tablet Take 400 mg by mouth every 8 (eight) hours as needed (pain).    . Multiple Vitamin (MULTIVITAMIN WITH MINERALS) TABS tablet Take 1 tablet by mouth every evening.    . promethazine-dextromethorphan (PROMETHAZINE-DM) 6.25-15 MG/5ML syrup Take 5 mLs by mouth 4 (four) times daily as needed for cough. (Patient not taking: Reported on 10/04/2020) 118 mL 0  . SUMAtriptan (IMITREX) 100 MG tablet Take 1 tablet (100 mg total) by mouth every 2 (two) hours as needed for migraine. May repeat in 2 hours if headache persists or recurs. (Patient not taking: Reported on 10/04/2020) 10 tablet 0    Allergies: No Known Allergies  Family History  Problem Relation Age of Onset  . Breast cancer Paternal Grandmother     Social History:  reports that she has quit smoking. She has never used smokeless tobacco. She reports current alcohol use. She reports that she does not use drugs.   ROS: As per  HPI, all others negative  Blood pressure (!) 143/76, pulse 81, temperature 98.2 F (36.8 C), temperature source Oral, resp. rate 16, height 5\' 3"  (1.6 m), weight (!) 176.9 kg, last menstrual period 10/01/2020, SpO2 97 %. General appearance: Obese, NAD HEENT:  Anicteric, NCAT NEURO:  Alert/oriented, non-focal ABD:  Soft  Results for orders placed or performed during the hospital encounter of 10/12/20 (from the past 48 hour(s))  Pregnancy, urine     Status: None   Collection Time: 10/12/20  9:22 AM  Result Value Ref Range   Preg Test, Ur NEGATIVE NEGATIVE    Comment:        THE SENSITIVITY OF THIS METHODOLOGY IS >20 mIU/mL. Performed at [facility], [address], [place], [place]    No results found.  Assessment/Plan  1.  Blood in stool. 2.  Positive celiac serology. 3.  Diarrhea. 4.  Endoscopy + Colonoscopy. 5.  Risks (bleeding, infection, bowel perforation that could require surgery, sedation-related changes in cardiopulmonary systems), benefits (identification and possible treatment of source of symptoms, exclusion of certain causes of symptoms), and alternatives (watchful waiting, radiographic imaging studies, empiric medical treatment) of upper endoscopy (EGD) were explained to patient/family in detail and patient wishes to proceed. 6.  Risks (bleeding, infection, bowel perforation that could require surgery, sedation-related changes in cardiopulmonary systems), benefits (identification and possible treatment of source of symptoms, exclusion of certain causes of symptoms), and alternatives (watchful waiting,  radiographic imaging studies, empiric medical treatment) of colonoscopy were explained to patient/family in detail and patient wishes to proceed.  Freddy Jaksch 10/12/2020, 9:56 AM

## 2020-10-12 NOTE — Anesthesia Preprocedure Evaluation (Addendum)
Anesthesia Evaluation  Patient identified by MRN, date of birth, ID band Patient awake    Reviewed: Allergy & Precautions, NPO status , Patient's Chart, lab work & pertinent test results  History of Anesthesia Complications (+) PONV and history of anesthetic complications  Airway Mallampati: II  TM Distance: >3 FB Neck ROM: Full    Dental no notable dental hx. (+) Teeth Intact, Dental Advisory Given   Pulmonary pneumonia, resolved, former smoker,  COVID 09/13/20- URI symptoms at the time, currently asymptomatic   Pulmonary exam normal breath sounds clear to auscultation       Cardiovascular negative cardio ROS Normal cardiovascular exam Rhythm:Regular Rate:Normal     Neuro/Psych negative neurological ROS  negative psych ROS   GI/Hepatic Neg liver ROS, Epigastric abdominal pain   Endo/Other  Morbid obesityBMI 69  Renal/GU negative Renal ROS  Female GU complaint     Musculoskeletal negative musculoskeletal ROS (+)   Abdominal (+) + obese,   Peds  Hematology negative hematology ROS (+)   Anesthesia Other Findings   Reproductive/Obstetrics negative OB ROS                           Anesthesia Physical Anesthesia Plan  ASA: III  Anesthesia Plan: MAC   Post-op Pain Management:    Induction:   PONV Risk Score and Plan: 2 and Propofol infusion and TIVA  Airway Management Planned: Natural Airway and Simple Face Mask  Additional Equipment: None  Intra-op Plan:   Post-operative Plan:   Informed Consent: I have reviewed the patients History and Physical, chart, labs and discussed the procedure including the risks, benefits and alternatives for the proposed anesthesia with the patient or authorized representative who has indicated his/her understanding and acceptance.       Plan Discussed with: CRNA  Anesthesia Plan Comments: (Will attempt MAC; low threshold for conversion to GA  given super morbid obesity and concerns for airway COVID 4 wks ago, possible reactive airway )       Anesthesia Quick Evaluation

## 2020-10-13 LAB — SURGICAL PATHOLOGY

## 2020-10-14 ENCOUNTER — Encounter (HOSPITAL_COMMUNITY): Payer: Self-pay | Admitting: Gastroenterology

## 2020-10-19 ENCOUNTER — Ambulatory Visit
Admission: RE | Admit: 2020-10-19 | Discharge: 2020-10-19 | Disposition: A | Discharge status: Home / Self Care | Payer: Managed Care, Other (non HMO) | Source: Ambulatory Visit | Attending: Physician Assistant | Admitting: Physician Assistant

## 2020-10-19 DIAGNOSIS — R1013 Epigastric pain: Secondary | ICD-10-CM

## 2020-11-04 ENCOUNTER — Other Ambulatory Visit (HOSPITAL_COMMUNITY): Payer: Self-pay | Admitting: Physician Assistant

## 2020-11-04 ENCOUNTER — Other Ambulatory Visit: Payer: Self-pay | Admitting: Physician Assistant

## 2020-11-04 DIAGNOSIS — R1011 Right upper quadrant pain: Secondary | ICD-10-CM

## 2020-11-22 ENCOUNTER — Ambulatory Visit: Payer: Managed Care, Other (non HMO) | Admitting: Nutrition

## 2020-11-24 ENCOUNTER — Encounter (HOSPITAL_COMMUNITY): Payer: Self-pay

## 2020-11-24 ENCOUNTER — Ambulatory Visit (HOSPITAL_COMMUNITY): Payer: Managed Care, Other (non HMO)

## 2021-02-22 DIAGNOSIS — K900 Celiac disease: Secondary | ICD-10-CM | POA: Insufficient documentation

## 2022-10-30 ENCOUNTER — Ambulatory Visit
Admission: EM | Admit: 2022-10-30 | Discharge: 2022-10-30 | Disposition: A | Discharge status: Home / Self Care | Payer: Managed Care, Other (non HMO)

## 2022-10-30 DIAGNOSIS — B349 Viral infection, unspecified: Secondary | ICD-10-CM | POA: Diagnosis not present

## 2022-10-30 NOTE — ED Triage Notes (Signed)
Patient to Urgent Care with complaints of facial pressure, ear pain/ aching, nasal congestion and sore throat. Husband sick with same symptoms.  Symptoms started Sunday. Denies any known fevers. Reports having some body aches.   Taking dayquil.

## 2022-10-30 NOTE — Discharge Instructions (Addendum)
Take plain Mucinex and Flonase nasal spray as directed.  Follow up with your primary care provider if your symptoms are not improving.

## 2022-10-30 NOTE — ED Provider Notes (Signed)
[name]    CSN: [id number] Arrival date & time: 10/30/22  1137      History   Chief Complaint Chief Complaint  Patient presents with   Facial Pain    HPI Angelica Alvarado is a 37 y.o. female.  Patient presents with 2 day history of sinus pressure, congestion, ear pain, sore throat, body aches.  Treatment with Dayquil.  No fever, rash, shortness of breath, vomiting, diarrhea, or other symptoms.   The history is provided by the patient and medical records.    Past Medical History:  Diagnosis Date   Polycystic ovarian disease    PONV (postoperative nausea and vomiting)     There are no problems to display for this patient.   Past Surgical History:  Procedure Laterality Date   BIOPSY  10/12/2020   Procedure: BIOPSY;  Surgeon: Arta Silence, MD;  Location: [facility];  Service: Endoscopy;;  EGD and COLON   COLONOSCOPY WITH PROPOFOL N/A 10/12/2020   Procedure: COLONOSCOPY WITH PROPOFOL;  Surgeon: Arta Silence, MD;  Location: [facility];  Service: Endoscopy;  Laterality: N/A;   ESOPHAGOGASTRODUODENOSCOPY (EGD) WITH PROPOFOL N/A 10/12/2020   Procedure: ESOPHAGOGASTRODUODENOSCOPY (EGD) WITH PROPOFOL;  Surgeon: Arta Silence, MD;  Location: [facility];  Service: Endoscopy;  Laterality: N/A;   LAPAROSCOPY Right 09/09/2015   Procedure: DIAGNOSTIC LAPAROSCOPY RIGHT CYST ASPIRATION AND CHROMOPERTUBATION;  Surgeon: Dian Queen, MD;  Location: [facility];  Service: Gynecology;  Laterality: Right;   LEEP     TONSILLECTOMY     tubes in ears       OB History   No obstetric history on file.      Home Medications    Prior to Admission medications   Medication Sig Start Date End Date Taking? Authorizing Provider  acetaminophen (TYLENOL) 500 MG tablet Take 500 mg by mouth every 6 (six) hours as needed (pain).    [provider]  amphetamine-dextroamphetamine (ADDERALL) 10 MG tablet Take 10 mg by mouth daily as needed (attention).    [provider]  Cholecalciferol (VITAMIN D-3) 125 MCG (5000 UT) TABS Take 5,000 Units by mouth every evening.    [provider]  ibuprofen (ADVIL) 200 MG tablet Take 400 mg by mouth every 8 (eight) hours as needed (pain).    [provider]  Multiple Vitamin (MULTIVITAMIN WITH MINERALS) TABS tablet Take 1 tablet by mouth every evening.    [provider]  OZEMPIC, 1 MG/DOSE, 4 MG/3ML SOPN INJECT 1 MG UNDER THE SKIN ONE DAY A WEEK.    [provider]    Family History Family History  Problem Relation Age of Onset   Breast cancer Paternal Grandmother     Social History Social History   Tobacco Use   Smoking status: Former   Smokeless tobacco: Never  Substance Use Topics   Alcohol use: Yes    Comment: occ   Drug use: No     Allergies   Patient has no known allergies.   Review of Systems Review of Systems  Constitutional:  Negative for chills and fever.  HENT:  Positive for congestion, ear pain, postnasal drip, rhinorrhea, sinus pressure and sore throat.   Respiratory:  Negative for cough and shortness of breath.   Cardiovascular:  Negative for chest pain and palpitations.  Gastrointestinal:  Negative for diarrhea and vomiting.  Musculoskeletal:  Negative for arthralgias.  Skin:  Negative for rash.  All other systems reviewed and are negative.    Physical Exam Triage  Vital Signs ED Triage Vitals  Enc Vitals Group     BP      Pulse      Resp      Temp      Temp src      SpO2      Weight      Height      Head Circumference      Peak Flow      Pain Score      Pain Loc      Pain Edu?      Excl. in [place]?    No data found.  Updated Vital Signs BP 125/83   Pulse 87   Temp 97.7 F (36.5 C)   Resp 18   SpO2 97%   Visual Acuity Right Eye Distance:   Left Eye Distance:   Bilateral Distance:    Right Eye Near:   Left Eye Near:    Bilateral Near:     Physical Exam Vitals and nursing note reviewed.  Constitutional:       General: She is not in acute distress.    Appearance: She is well-developed. She is obese. She is not ill-appearing.  HENT:     Right Ear: Tympanic membrane normal.     Left Ear: Tympanic membrane normal.     Nose: Congestion and rhinorrhea present.     Mouth/Throat:     Mouth: Mucous membranes are moist.     Pharynx: Oropharynx is clear.  Cardiovascular:     Rate and Rhythm: Normal rate and regular rhythm.     Heart sounds: Normal heart sounds.  Pulmonary:     Effort: Pulmonary effort is normal. No respiratory distress.     Breath sounds: Normal breath sounds.  Musculoskeletal:     Cervical back: Neck supple.  Skin:    General: Skin is warm and dry.  Neurological:     Mental Status: She is alert.  Psychiatric:        Mood and Affect: Mood normal.        Behavior: Behavior normal.      UC Treatments / Results  Labs (all labs ordered are listed, but only abnormal results are displayed) Labs Reviewed - No data to display  EKG   Radiology No results found.  Procedures Procedures (including critical care time)  Medications Ordered in UC Medications - No data to display  Initial Impression / Assessment and Plan / UC Course  I have reviewed the triage vital signs and the nursing notes.  Pertinent labs & imaging results that were available during my care of the patient were reviewed by me and considered in my medical decision making (see chart for details).    Viral illness.  Patient declines prednisone or prescription symptom management.  Discussed antibiotic stewardship.  Education provided on viral illness.  Discussed OTC Flonase and plain Mucinex.  Instructed patient to follow up with her PCP if her symptoms are not improving.  She agrees to plan of care.    Final Clinical Impressions(s) / UC Diagnoses   Final diagnoses:  Viral illness     Discharge Instructions      Take plain Mucinex and Flonase nasal spray as directed.  Follow up with your primary  care provider if your symptoms are not improving.        ED Prescriptions   None    PDMP not reviewed this encounter.   Sharion Balloon, NP 10/30/22 1229

## 2022-10-31 ENCOUNTER — Encounter: Payer: Self-pay | Admitting: Emergency Medicine

## 2022-10-31 ENCOUNTER — Ambulatory Visit
Admission: EM | Admit: 2022-10-31 | Discharge: 2022-10-31 | Disposition: A | Discharge status: Home / Self Care | Payer: Managed Care, Other (non HMO) | Attending: Nurse Practitioner | Admitting: Nurse Practitioner

## 2022-10-31 DIAGNOSIS — U071 COVID-19: Secondary | ICD-10-CM | POA: Diagnosis not present

## 2022-10-31 MED ORDER — FLUTICASONE PROPIONATE 50 MCG/ACT NA SUSP: 2.0000 | Freq: Every day | NASAL | 0 refills | Status: DC

## 2022-10-31 MED ORDER — KETOROLAC TROMETHAMINE 30 MG/ML IJ SOLN
30.0000 mg | Freq: Once | INTRAMUSCULAR | Status: AC
  Administered 2022-10-31: 30 mg via INTRAMUSCULAR

## 2022-10-31 MED ORDER — PAXLOVID (300/100) 20 X 150 MG & 10 X 100MG PO TBPK: 3.0000 | ORAL_TABLET | Freq: Two times a day (BID) | ORAL | 0 refills | Status: AC

## 2022-10-31 MED ORDER — PROMETHAZINE-DM 6.25-15 MG/5ML PO SYRP: 5.0000 mL | ORAL_SOLUTION | Freq: Four times a day (QID) | ORAL | 0 refills | Status: DC | PRN

## 2022-10-31 NOTE — Discharge Instructions (Addendum)
Take medication as prescribed. Increase fluids and allow for plenty of rest. Continue alternating Tylenol and ibuprofen as needed for pain, fever, or general discomfort. Recommend use of a humidifier in your bedroom at nighttime during sleep for cough and nasal congestion. As discussed, you will need to remain isolated until you have been fever free for at least 24 hours with no medication.  If you continue to experience symptoms, you will need to continue to wear your mask for an additional 5 days. If you develop shortness of breath, difficulty breathing, worsening headache, or other concerns, please follow-up in the emergency department for further evaluation. Follow-up as needed.

## 2022-10-31 NOTE — ED Triage Notes (Signed)
Home covid test yesterday was positive.  Congestion, body aches and headaches and fatigue since Sunday.  Has been taking day quil to help with symptoms.

## 2022-10-31 NOTE — ED Provider Notes (Signed)
[facility]    CSN: [id number] Arrival date & time: 10/31/22  0941      History   Chief Complaint No chief complaint on file.   HPI Angelica Alvarado is a 37 y.o. female.   The history is provided by the patient.   Patient presents for complaints of fever, body aches, chills, nasal congestion, headaches, and a mild cough.  She states she had a fever this morning of "99".  She states symptoms started over the past 3 days.  She states that her headache is the worst of her symptoms at this time.  She reports she has been taking over-the-counter Tylenol and NyQuil for her symptoms.  She reports that she did receive the first 2 COVID vaccines, but no booster.  Patient states she took a home COVID test yesterday, which was positive. Past Medical History:  Diagnosis Date   Polycystic ovarian disease    PONV (postoperative nausea and vomiting)     There are no problems to display for this patient.   Past Surgical History:  Procedure Laterality Date   BIOPSY  10/12/2020   Procedure: BIOPSY;  Surgeon: Arta Silence, MD;  Location: [facility];  Service: Endoscopy;;  EGD and COLON   COLONOSCOPY WITH PROPOFOL N/A 10/12/2020   Procedure: COLONOSCOPY WITH PROPOFOL;  Surgeon: Arta Silence, MD;  Location: [facility];  Service: Endoscopy;  Laterality: N/A;   ESOPHAGOGASTRODUODENOSCOPY (EGD) WITH PROPOFOL N/A 10/12/2020   Procedure: ESOPHAGOGASTRODUODENOSCOPY (EGD) WITH PROPOFOL;  Surgeon: Arta Silence, MD;  Location: [facility];  Service: Endoscopy;  Laterality: N/A;   LAPAROSCOPY Right 09/09/2015   Procedure: DIAGNOSTIC LAPAROSCOPY RIGHT CYST ASPIRATION AND CHROMOPERTUBATION;  Surgeon: Dian Queen, MD;  Location: [facility];  Service: Gynecology;  Laterality: Right;   LEEP     TONSILLECTOMY     tubes in ears       OB History   No obstetric history on file.      Home Medications    Prior to Admission medications   Medication Sig Start Date End Date  Taking? Authorizing Provider  fluticasone (FLONASE) 50 MCG/ACT nasal spray Place 2 sprays into both nostrils daily. 10/31/22  Yes Doralee Kocak-Warren, Alda Lea, NP  nirmatrelvir & ritonavir (PAXLOVID, 300/100,) 20 x 150 MG & 10 x '100MG'$  TBPK Take 3 tablets by mouth 2 (two) times daily for 5 days. 10/31/22 11/05/22 Yes Rebbecca Osuna-Warren, Alda Lea, NP  promethazine-dextromethorphan (PROMETHAZINE-DM) 6.25-15 MG/5ML syrup Take 5 mLs by mouth 4 (four) times daily as needed for cough. 10/31/22  Yes Clever Geraldo-Warren, Alda Lea, NP  acetaminophen (TYLENOL) 500 MG tablet Take 500 mg by mouth every 6 (six) hours as needed (pain).    [provider]  amphetamine-dextroamphetamine (ADDERALL) 10 MG tablet Take 10 mg by mouth daily as needed (attention).    [provider]  Cholecalciferol (VITAMIN D-3) 125 MCG (5000 UT) TABS Take 5,000 Units by mouth every evening.    [provider]  ibuprofen (ADVIL) 200 MG tablet Take 400 mg by mouth every 8 (eight) hours as needed (pain).    [provider]  Multiple Vitamin (MULTIVITAMIN WITH MINERALS) TABS tablet Take 1 tablet by mouth every evening.    [provider]  OZEMPIC, 1 MG/DOSE, 4 MG/3ML SOPN INJECT 1 MG UNDER THE SKIN ONE DAY A WEEK.    [provider]    Family History Family History  Problem Relation Age of Onset   Breast cancer Paternal Grandmother     Social History Social History  Tobacco Use   Smoking status: Former   Smokeless tobacco: Never  Substance Use Topics   Alcohol use: Yes    Comment: occ   Drug use: No     Allergies   Gluten meal   Review of Systems Review of Systems Per HPI  Physical Exam Triage Vital Signs ED Triage Vitals  Enc Vitals Group     BP 10/31/22 1032 (!) 155/80     Pulse Rate 10/31/22 1032 (!) 104     Resp 10/31/22 1032 18     Temp 10/31/22 1032 99.6 F (37.6 C)     Temp Source 10/31/22 1032 Temporal     SpO2 10/31/22 1032 97 %     Weight --      Height --       Head Circumference --      Peak Flow --      Pain Score 10/31/22 1033 5     Pain Loc --      Pain Edu? --      Excl. in [place]? --    No data found.  Updated Vital Signs BP (!) 155/80 (BP Location: Right Arm)   Pulse (!) 104   Temp 99.6 F (37.6 C) (Temporal)   Resp 18   SpO2 97%   Visual Acuity Right Eye Distance:   Left Eye Distance:   Bilateral Distance:    Right Eye Near:   Left Eye Near:    Bilateral Near:     Physical Exam Vitals and nursing note reviewed.  Constitutional:      General: She is not in acute distress.    Appearance: Normal appearance.  HENT:     Head: Normocephalic.     Right Ear: Tympanic membrane, ear canal and external ear normal.     Left Ear: Tympanic membrane, ear canal and external ear normal.     Nose: Congestion and rhinorrhea present.     Mouth/Throat:     Mouth: Mucous membranes are moist.     Pharynx: Posterior oropharyngeal erythema present.  Eyes:     Extraocular Movements: Extraocular movements intact.     Conjunctiva/sclera: Conjunctivae normal.     Pupils: Pupils are equal, round, and reactive to light.  Cardiovascular:     Rate and Rhythm: Normal rate and regular rhythm.     Pulses: Normal pulses.     Heart sounds: Normal heart sounds.  Pulmonary:     Effort: Pulmonary effort is normal.     Breath sounds: Normal breath sounds.  Abdominal:     General: Bowel sounds are normal.     Palpations: Abdomen is soft.     Tenderness: There is no abdominal tenderness.  Musculoskeletal:     Cervical back: Normal range of motion.  Skin:    General: Skin is warm and dry.  Neurological:     General: No focal deficit present.     Mental Status: She is alert and oriented to person, place, and time.  Psychiatric:        Mood and Affect: Mood normal.        Behavior: Behavior normal.      UC Treatments / Results  Labs (all labs ordered are listed, but only abnormal results are displayed) Labs Reviewed - No data to  display  EKG   Radiology No results found.  Procedures Procedures (including critical care time)  Medications Ordered in UC Medications  ketorolac (TORADOL) 30 MG/ML injection 30 mg (30 mg Intramuscular Given 10/31/22  1112)    Initial Impression / Assessment and Plan / UC Course  I have reviewed the triage vital signs and the nursing notes.  Pertinent labs & imaging results that were available during my care of the patient were reviewed by me and considered in my medical decision making (see chart for details).  The patient is well-appearing, she is in no acute distress, vital signs are stable.  Patient with positive self-administered home COVID test.  Will treat patient with Paxlovid to treat COVID, and for her cough, Promethazine DM was prescribed.  Patient was given Toradol 30 mg IM in the clinic to help with her headache.  Supportive care recommendations were discussed with the patient along with providing new recommendations for isolation recently published by the CDC.  Patient was advised that if she has been fever free for at least 24 hours, she can return to her normal activities with a mask if she is still experiencing symptoms.  Patient was given strict ER follow-up precautions.  Patient is in agreement with this plan of care and verbalizes understanding.  All questions were answered.  Patient stable for discharge.  Final Clinical Impressions(s) / UC Diagnoses   Final diagnoses:  Positive self-administered antigen test for COVID-19     Discharge Instructions      Take medication as prescribed. Increase fluids and allow for plenty of rest. Continue alternating Tylenol and ibuprofen as needed for pain, fever, or general discomfort. Recommend use of a humidifier in your bedroom at nighttime during sleep for cough and nasal congestion. As discussed, you will need to remain isolated until you have been fever free for at least 24 hours with no medication.  If you continue to  experience symptoms, you will need to continue to wear your mask for an additional 5 days. If you develop shortness of breath, difficulty breathing, worsening headache, or other concerns, please follow-up in the emergency department for further evaluation. Follow-up as needed.     ED Prescriptions     Medication Sig Dispense Auth. Provider   nirmatrelvir & ritonavir (PAXLOVID, 300/100,) 20 x 150 MG & 10 x '100MG'$  TBPK Take 3 tablets by mouth 2 (two) times daily for 5 days. 30 tablet Blaine Hari-Warren, Alda Lea, NP   promethazine-dextromethorphan (PROMETHAZINE-DM) 6.25-15 MG/5ML syrup Take 5 mLs by mouth 4 (four) times daily as needed for cough. 118 mL Rusty Villella-Warren, Alda Lea, NP   fluticasone (FLONASE) 50 MCG/ACT nasal spray Place 2 sprays into both nostrils daily. 16 g Marvin Maenza-Warren, Alda Lea, NP      PDMP not reviewed this encounter.   Tish Men, NP 10/31/22 1151

## 2022-11-02 IMAGING — US US ABDOMEN COMPLETE
1 series · 14 of 25 positions shown · non-contrast
Comparison: CT 08/31/2006

CLINICAL DATA: Epigastric abdominal pain

EXAM:
ABDOMEN ULTRASOUND COMPLETE

[Series 1: us abdomen complete · 0.32mm/px · 14 of 80 slices shown]
[im 1/80]
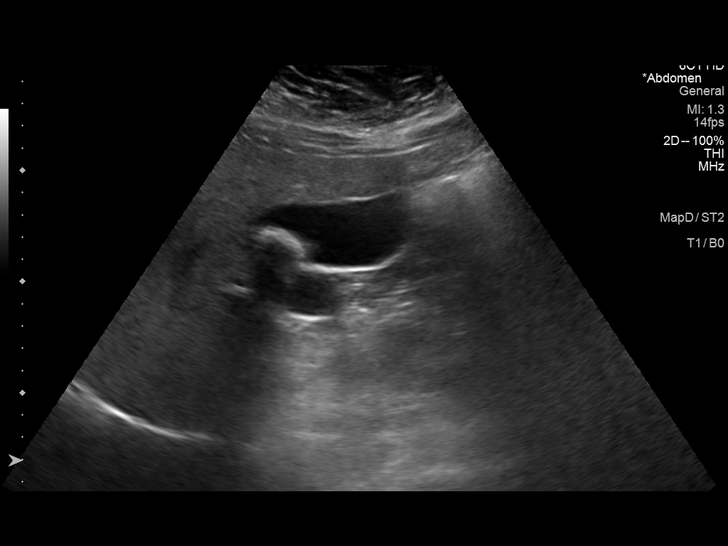
[im 7/80]
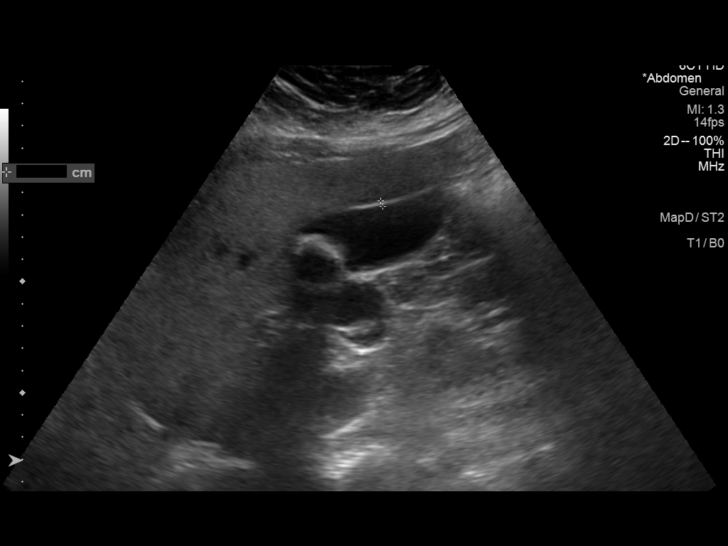
[im 14/80]
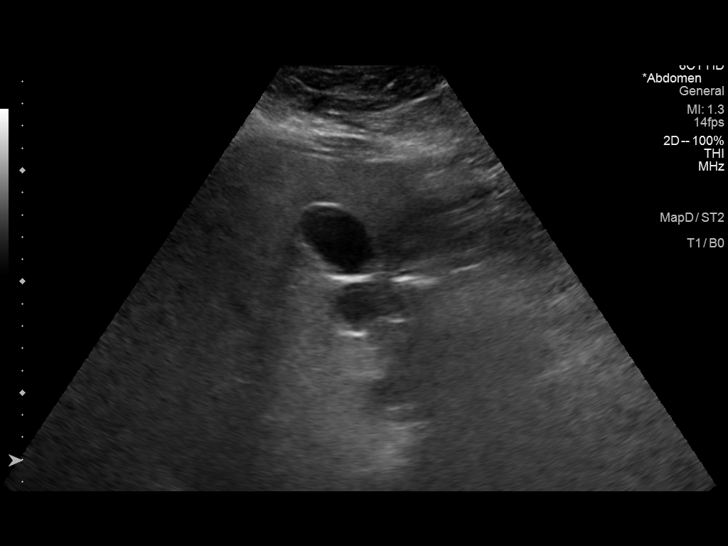
[im 20/80]
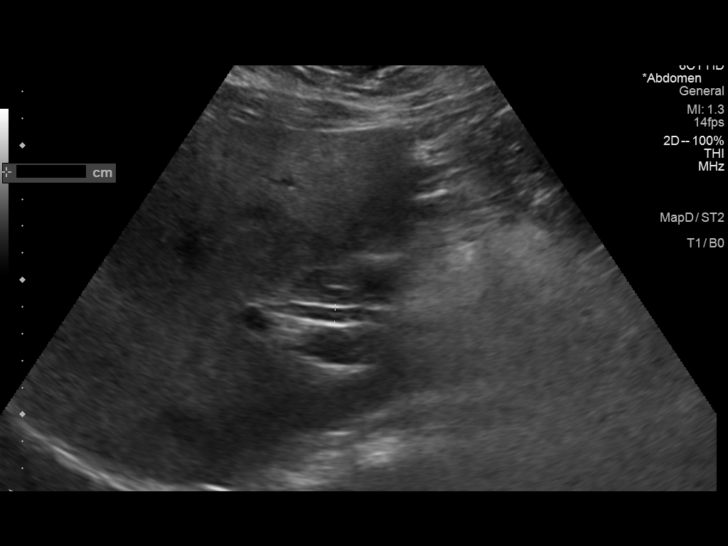
[im 27/80]
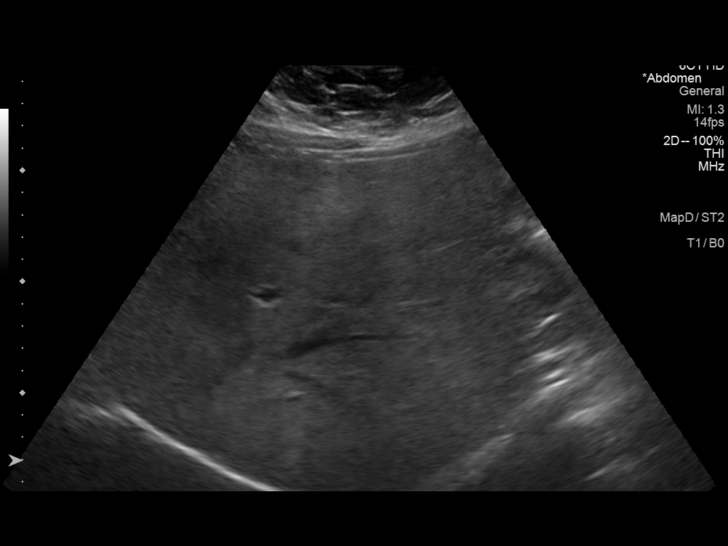
[im 30/80]
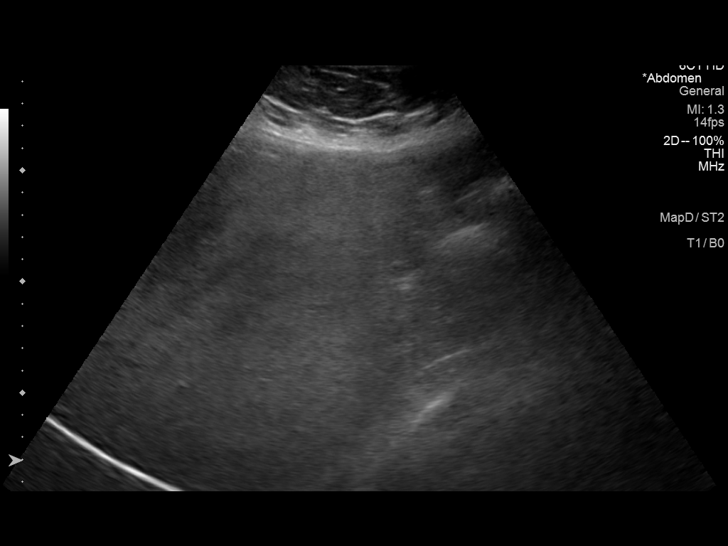
[im 37/80]
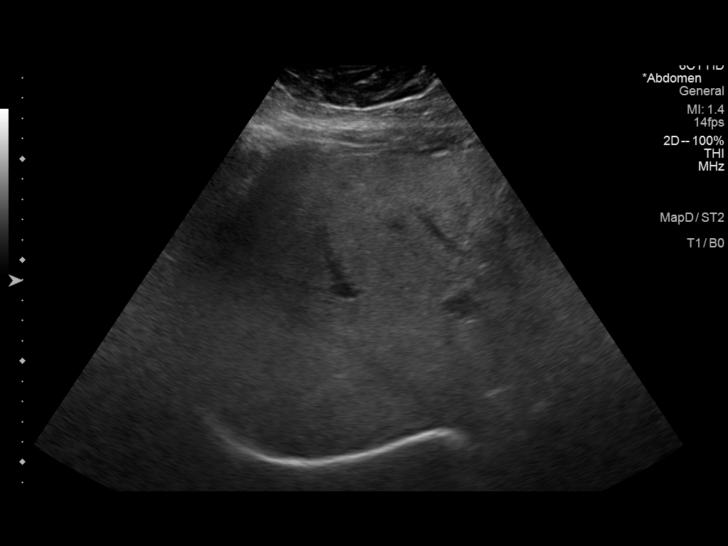
[im 43/80]
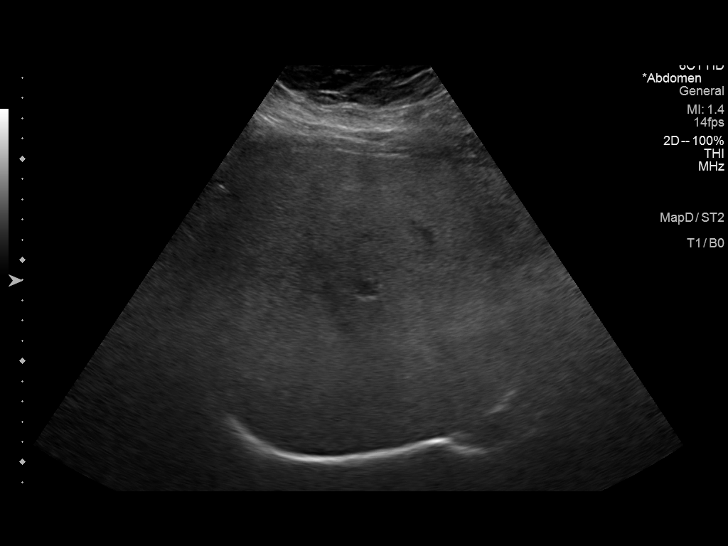
[im 50/80]
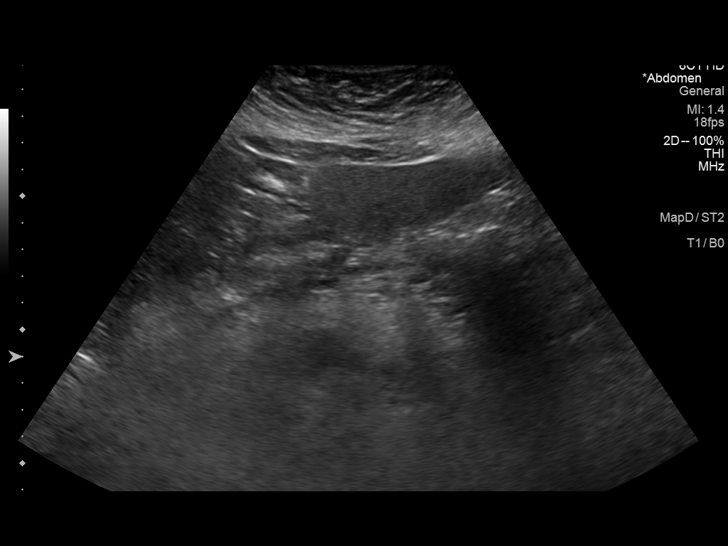
[im 53/80]
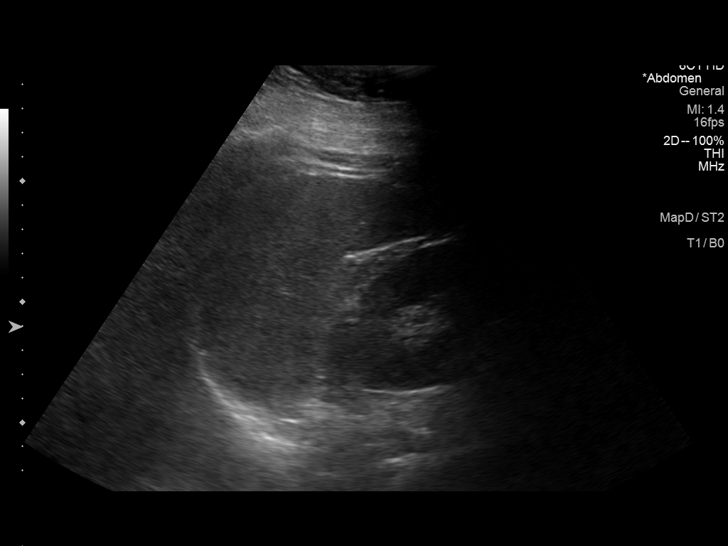
[im 60/80]
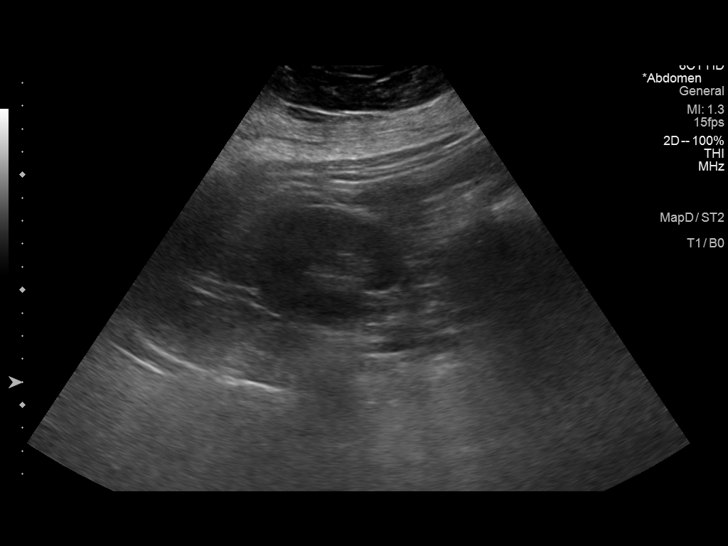
[im 66/80]
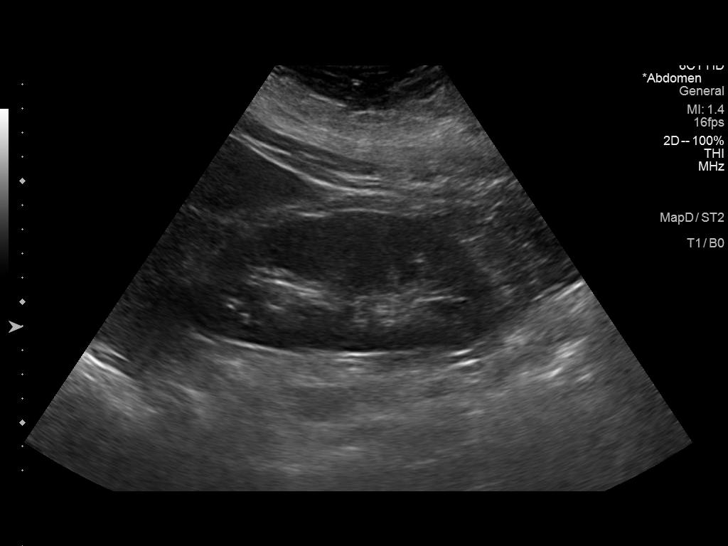
[im 73/80]
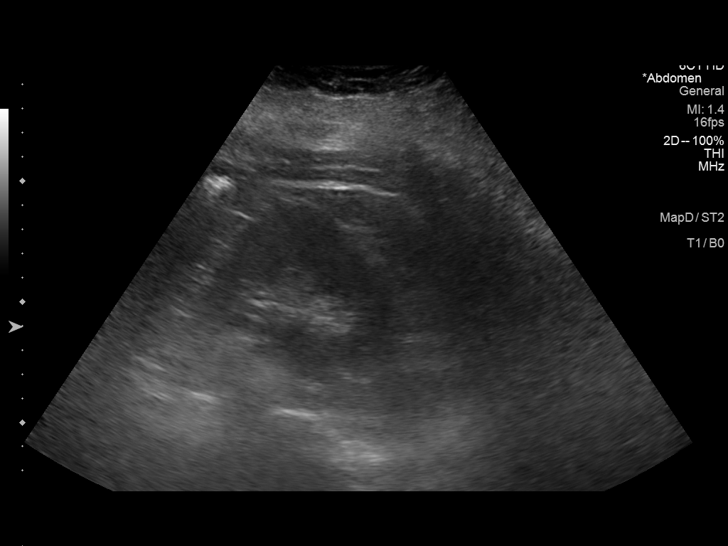
[im 80/80]
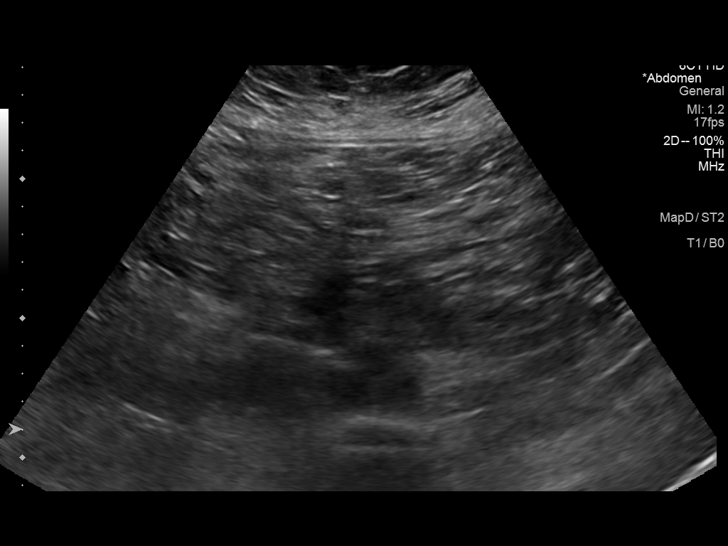

[14 of 25 positions shown; findings below may reference images not displayed]

FINDINGS: Gallbladder: Shadowing stones measuring up to 3.5 cm. Probable small
stone at the neck of the gallbladder. Negative sonographic Murphy.
Normal wall thickness.

Common bile duct: Diameter: 6.1 mm

Liver: Liver is echogenic. No focal hepatic abnormality. Portal vein
is patent on color Doppler imaging with normal direction of blood
flow towards the liver.

IVC: No abnormality visualized.

Pancreas: Visualized portion unremarkable.

Spleen: Size and appearance within normal limits.

Right Kidney: Length: 14 cm. Echogenicity within normal limits. No
mass or hydronephrosis visualized.

Left Kidney: Length: 13 cm. Echogenicity within normal limits. No
mass or hydronephrosis visualized.

Abdominal aorta: No aneurysm visualized.

Other findings: None.
IMPRESSION: 1. Cholelithiasis without sonographic evidence for acute
cholecystitis. Upper normal common bile duct diameter which may be
correlated with LFTs.
2. Diffusely echogenic liver consistent with steatosis

## 2022-12-27 ENCOUNTER — Other Ambulatory Visit: Payer: Self-pay

## 2022-12-27 ENCOUNTER — Emergency Department
Admission: EM | Admit: 2022-12-27 | Discharge: 2022-12-27 | Disposition: A | Discharge status: Home / Self Care | Payer: Managed Care, Other (non HMO) | Attending: Emergency Medicine | Admitting: Emergency Medicine

## 2022-12-27 ENCOUNTER — Encounter: Payer: Self-pay | Admitting: *Deleted

## 2022-12-27 ENCOUNTER — Emergency Department: Payer: Managed Care, Other (non HMO)

## 2022-12-27 DIAGNOSIS — N946 Dysmenorrhea, unspecified: Secondary | ICD-10-CM

## 2022-12-27 DIAGNOSIS — R102 Pelvic and perineal pain: Secondary | ICD-10-CM | POA: Diagnosis present

## 2022-12-27 DIAGNOSIS — N83201 Unspecified ovarian cyst, right side: Secondary | ICD-10-CM | POA: Diagnosis not present

## 2022-12-27 LAB — CBC
HCT: 45.2 % (ref 36.0–46.0)
Hemoglobin: 14.7 g/dL (ref 12.0–15.0)
MCH: 27.7 pg (ref 26.0–34.0)
MCHC: 32.5 g/dL (ref 30.0–36.0)
MCV: 85.1 fL (ref 80.0–100.0)
Platelets: 297 10*3/uL (ref 150–400)
RBC: 5.31 MIL/uL — ABNORMAL HIGH (ref 3.87–5.11)
RDW: 14 % (ref 11.5–15.5)
WBC: 12.1 10*3/uL — ABNORMAL HIGH (ref 4.0–10.5)
nRBC: 0 % (ref 0.0–0.2)

## 2022-12-27 LAB — COMPREHENSIVE METABOLIC PANEL
ALT: 20 U/L (ref 0–44)
AST: 23 U/L (ref 15–41)
Albumin: 4.4 g/dL (ref 3.5–5.0)
Alkaline Phosphatase: 71 U/L (ref 38–126)
Anion gap: 10 (ref 5–15)
BUN: 13 mg/dL (ref 6–20)
CO2: 23 mmol/L (ref 22–32)
Calcium: 9.6 mg/dL (ref 8.9–10.3)
Chloride: 104 mmol/L (ref 98–111)
Creatinine, Ser: 0.67 mg/dL (ref 0.44–1.00)
GFR, Estimated: >60 mL/min (ref >60)
Glucose, Bld: 110 mg/dL — ABNORMAL HIGH (ref 70–99)
Potassium: 3.7 mmol/L (ref 3.5–5.1)
Sodium: 137 mmol/L (ref 135–145)
Total Bilirubin: 0.8 mg/dL (ref 0.3–1.2)
Total Protein: 8.2 g/dL — ABNORMAL HIGH (ref 6.5–8.1)

## 2022-12-27 LAB — URINALYSIS, ROUTINE W REFLEX MICROSCOPIC
Bacteria, UA: NONE SEEN
Bilirubin Urine: NEGATIVE
Glucose, UA: NEGATIVE mg/dL
Ketones, ur: 20 mg/dL — AB
Leukocytes,Ua: NEGATIVE
Nitrite: NEGATIVE
Protein, ur: NEGATIVE mg/dL
Specific Gravity, Urine: 1.004 — ABNORMAL LOW (ref 1.005–1.030)
pH: 6 (ref 5.0–8.0)

## 2022-12-27 LAB — PREGNANCY, URINE: Preg Test, Ur: NEGATIVE

## 2022-12-27 LAB — LIPASE, BLOOD: Lipase: 44 U/L (ref 11–51)

## 2022-12-27 MED ORDER — ONDANSETRON 4 MG PO TBDP
4.0000 mg | ORAL_TABLET | Freq: Once | ORAL | Status: AC
  Administered 2022-12-27: 4 mg via ORAL
  Filled 2022-12-27: qty 1

## 2022-12-27 MED ORDER — KETOROLAC TROMETHAMINE 60 MG/2ML IM SOLN
60.0000 mg | Freq: Once | INTRAMUSCULAR | Status: AC
  Administered 2022-12-27: 60 mg via INTRAMUSCULAR
  Filled 2022-12-27: qty 2

## 2022-12-27 MED ORDER — HYDROCODONE-ACETAMINOPHEN 5-325 MG PO TABS
1.0000 | ORAL_TABLET | Freq: Once | ORAL | Status: AC
  Administered 2022-12-27: 1 via ORAL
  Filled 2022-12-27: qty 1

## 2022-12-27 MED ORDER — ONDANSETRON 4 MG PO TBDP: 4.0000 mg | ORAL_TABLET | Freq: Three times a day (TID) | ORAL | 0 refills | Status: DC | PRN

## 2022-12-27 MED ORDER — HYDROCODONE-ACETAMINOPHEN 5-325 MG PO TABS: 1.0000 | ORAL_TABLET | Freq: Three times a day (TID) | ORAL | 0 refills | Status: AC | PRN

## 2022-12-27 MED ORDER — IBUPROFEN 800 MG PO TABS: 800.0000 mg | ORAL_TABLET | Freq: Three times a day (TID) | ORAL | 0 refills | Status: DC | PRN

## 2022-12-27 NOTE — ED Provider Notes (Signed)
[facility] Emergency Department Provider Note     Event Date/Time   First MD Initiated Contact with Patient 12/27/22 1811     (approximate)   History   Abdominal Pain   HPI  Angelica Alvarado is a 37 y.o. female with a history of obesity, PCOS, presents to the ED with pelvic pain with onset about 100,000.  Patient reports onset with her menses after passing 2 large clots and having to large waves of pelvic cramping.  She describes uterine pressure at this time.  She denies any nausea, vomiting, diarrhea.  Patient denies any fevers, chills, sweats.  No change in bladder or bowel habits at this time.  Reports her LMP was 28 days ago at the end of March.  Physical Exam   Triage Vital Signs: ED Triage Vitals  Enc Vitals Group     BP 12/27/22 1757 (!) 174/106     Pulse Rate 12/27/22 1757 78     Resp 12/27/22 1757 18     Temp 12/27/22 1757 98.7 F (37.1 C)     Temp Source 12/27/22 1757 Oral     SpO2 12/27/22 1757 98 %     Weight 12/27/22 1800 (!) 377 lb (171 kg)     Height 12/27/22 1800 5\' 3"  (1.6 m)     Head Circumference --      Peak Flow --      Pain Score 12/27/22 1800 9     Pain Loc --      Pain Edu? --      Excl. in GC? --     Most recent vital signs: Vitals:   12/27/22 1800 12/27/22 2128  BP: (!) 152/100 (!) 146/95  Pulse: 83 85  Resp: 20 20  Temp: 98.7 F (37.1 C) 98 F (36.7 C)  SpO2: 99% 98%    General Awake, no distress. NAD CV:  Good peripheral perfusion.  RESP:  Normal effort.  ABD:  No distention. Soft, nontender. No CVA tenderness GU:  deferred   ED Results / Procedures / Treatments   Labs (all labs ordered are listed, but only abnormal results are displayed) Labs Reviewed  COMPREHENSIVE METABOLIC PANEL - Abnormal; Notable for the following components:      Result Value   Glucose, Bld 110 (*)    Total Protein 8.2 (*)    All other components within normal limits  CBC - Abnormal; Notable for the following  components:   WBC 12.1 (*)    RBC 5.31 (*)    All other components within normal limits  URINALYSIS, ROUTINE W REFLEX MICROSCOPIC - Abnormal; Notable for the following components:   Color, Urine COLORLESS (*)    APPearance CLEAR (*)    Specific Gravity, Urine 1.004 (*)    Hgb urine dipstick LARGE (*)    Ketones, ur 20 (*)    All other components within normal limits  LIPASE, BLOOD  PREGNANCY, URINE     EKG   RADIOLOGY  I personally viewed and evaluated these images as part of my medical decision making, as well as reviewing the written report by the radiologist.  ED Provider Interpretation: right ovarian cyst w/o torsion  US PELVIC COMPLETE W TRANSVAGINAL AND TORSION R/O  Result Date: 12/27/2022 CLINICAL DATA:  Pelvic pain vaginal bleeding EXAM: TRANSABDOMINAL AND TRANSVAGINAL ULTRASOUND OF PELVIS DOPPLER ULTRASOUND OF OVARIES TECHNIQUE: Both transabdominal and transvaginal ultrasound examinations of the pelvis were performed. Transabdominal technique was performed for global imaging of  the pelvis including uterus, ovaries, adnexal regions, and pelvic cul-de-sac. It was necessary to proceed with endovaginal exam following the transabdominal exam to visualize the uterus endometrium ovaries. Color and duplex Doppler ultrasound was utilized to evaluate blood flow to the ovaries. COMPARISON:  Ultrasound 10/18/2026 FINDINGS: Uterus Measurements: 10.6 x 3.9 x 4.6 cm = volume: 99 mL. No fibroids or other mass visualized. Endometrium Thickness: 16 mm. Endometrial canal is distended by heterogenous hyper and hypoechoic avascular material probably representing hematoma/blood product. Moderate to large amount of complex fluid in the cervix as well Right ovary Measurements: 8.2 x 6.3 x 6.4 cm = volume: 174 mL. Right ovary largely replaced by a cyst measuring 6.8 x 5.6 x 6 cm Left ovary Not seen Pulsed Doppler evaluation of the right demonstrates normal low-resistance arterial and venous waveforms.  Mostly at periphery of right ovary due to large cyst Other findings No abnormal free fluid. IMPRESSION: 1. Endometrial canal is distended by large amount of heterogenous hyper and hypoechoic avascular material probably representing hematoma/blood product. Moderate to large amount of complex fluid in the cervix as well. 2. Large 6.8 cm right ovarian cyst. No evidence for right ovarian torsion. Recommend follow-up US in 3-6 months. Note: This recommendation does not apply to premenarchal patients or to those with increased risk (genetic, family history, elevated tumor markers or other high-risk factors) of ovarian cancer. Reference: Radiology 2019 Nov; 293(2):359-371. 3. Left ovary is not visualized. Electronically Signed   By: Jasmine Pang M.D.   On: 12/27/2022 20:05     PROCEDURES:  Critical Care performed: No  Procedures   MEDICATIONS ORDERED IN ED: Medications  ketorolac (TORADOL) injection 60 mg (60 mg Intramuscular Given 12/27/22 1850)  ondansetron (ZOFRAN-ODT) disintegrating tablet 4 mg (4 mg Oral Given 12/27/22 1850)  HYDROcodone-acetaminophen (NORCO/VICODIN) 5-325 MG per tablet 1 tablet (1 tablet Oral Given 12/27/22 2128)     IMPRESSION / MDM / ASSESSMENT AND PLAN / ED COURSE  I reviewed the triage vital signs and the nursing notes.                              Differential diagnosis includes, but is not limited to, menstrual pain, ovarian cyst, ovarian torsion, acute appendicitis, diverticulitis, urinary tract infection/pyelonephritis, endometriosis, bowel obstruction, colitis, renal colic, gastroenteritis, hernia, fibroids, endometriosis, pregnancy related pain including ectopic pregnancy, etc.   Patient's presentation is most consistent with acute complicated illness / injury requiring diagnostic workup.  Patient's diagnosis is consistent with pain secondary to menstrual bleeding and ovarian cyst. Patient will be discharged home with prescriptions for Vicodin, ibuprofen, and Zofran.  Patient is to follow up with her GYN as needed or otherwise directed. Patient is given ED precautions to return to the ED for any worsening or new symptoms.  FINAL CLINICAL IMPRESSION(S) / ED DIAGNOSES   Final diagnoses:  Cyst of right ovary  Menstrual pain     Rx / DC Orders   ED Discharge Orders          Ordered    HYDROcodone-acetaminophen (NORCO) 5-325 MG tablet  3 times daily PRN        12/27/22 2120    ibuprofen (ADVIL) 800 MG tablet  Every 8 hours PRN        12/27/22 2120    ondansetron (ZOFRAN-ODT) 4 MG disintegrating tablet  Every 8 hours PRN        12/27/22 2120  Note:  This document was prepared using Dragon voice recognition software and may include unintentional dictation errors.    Lissa Hoard, PA-C 12/27/22 2132    Jene Every, MD 01/01/23 [phone]

## 2022-12-27 NOTE — ED Triage Notes (Signed)
Pt has lower abd pain since 1330 today.  Pt took tylenol and motrin without relief. Pt reports uterine pressure.  Pt has menses that also started today.  Pt alert.

## 2022-12-27 NOTE — Discharge Instructions (Addendum)
Your exam and labs are reassuring at this time.  Ultrasound confirms menstrual bleeding as well as a large right ovarian cyst.  Recommendation is that you follow with your gynecologist and have a repeat ultrasound in 3 to 6 months.  Take the prescription pain medicine as directed.

## 2023-07-17 ENCOUNTER — Ambulatory Visit
Admission: EM | Admit: 2023-07-17 | Discharge: 2023-07-17 | Disposition: A | Discharge status: Home / Self Care | Payer: Managed Care, Other (non HMO)

## 2023-07-17 ENCOUNTER — Other Ambulatory Visit: Payer: Self-pay

## 2023-07-17 ENCOUNTER — Encounter: Payer: Self-pay | Admitting: Emergency Medicine

## 2023-07-17 DIAGNOSIS — R1010 Upper abdominal pain, unspecified: Secondary | ICD-10-CM

## 2023-07-17 HISTORY — DX: Celiac disease: K90.0

## 2023-07-17 LAB — POCT URINALYSIS DIP (MANUAL ENTRY)
Bilirubin, UA: NEGATIVE
Glucose, UA: NEGATIVE mg/dL
Leukocytes, UA: NEGATIVE
Nitrite, UA: NEGATIVE
Spec Grav, UA: 1.015 (ref 1.010–1.025)
Urobilinogen, UA: 1 U/dL
pH, UA: 7 (ref 5.0–8.0)

## 2023-07-17 MED ORDER — ALUM & MAG HYDROXIDE-SIMETH 200-200-20 MG/5ML PO SUSP
30.0000 mL | Freq: Once | ORAL | Status: AC
  Administered 2023-07-17: 30 mL via ORAL

## 2023-07-17 MED ORDER — SUCRALFATE 1 G PO TABS: 1.0000 g | ORAL_TABLET | Freq: Three times a day (TID) | ORAL | 0 refills | Status: DC | PRN

## 2023-07-17 MED ORDER — LIDOCAINE VISCOUS HCL 2 % MT SOLN
15.0000 mL | Freq: Once | OROMUCOSAL | Status: AC
  Administered 2023-07-17: 15 mL via OROMUCOSAL

## 2023-07-17 MED ORDER — PANTOPRAZOLE SODIUM 40 MG PO TBEC: 40.0000 mg | DELAYED_RELEASE_TABLET | Freq: Every day | ORAL | 0 refills | Status: DC

## 2023-07-17 NOTE — ED Provider Notes (Signed)
[facility]    CSN: [id number] Arrival date & time: 07/17/23  0836      History   Chief Complaint Chief Complaint  Patient presents with   Abdominal Pain    HPI Angelica Alvarado is a 37 y.o. female.   Patient presenting today with epigastric pain, nausea since about 2 AM this morning.  She states she took some Mylanta and walked around, was able to belch, pass gas and have a BM and states her pain went from an 8 out of 10 to a 2 out of 10 after that.  She then took a Zofran as she was feeling nauseated particularly when she applied pressure to the upper abdominal region and this helped additionally.  Denies vomiting, diarrhea, melena, fever, chills, upper respiratory symptoms, new foods or medications, recent sick contacts.  States she did have labs through her OB/GYN yesterday with one of the only abnormalities being elevated liver enzymes.  She does have a known history of fatty liver disease and gallstones, otherwise no known chronic GI issues.  Of note, she is also on Ozempic the past year but overall feels like she has done well regarding GI side effects.    Past Medical History:  Diagnosis Date   Celiac disease    Polycystic ovarian disease    PONV (postoperative nausea and vomiting)     There are no problems to display for this patient.   Past Surgical History:  Procedure Laterality Date   BIOPSY  10/12/2020   Procedure: BIOPSY;  Surgeon: Willis Modena, MD;  Location: [facility];  Service: Endoscopy;;  EGD and COLON   COLONOSCOPY WITH PROPOFOL N/A 10/12/2020   Procedure: COLONOSCOPY WITH PROPOFOL;  Surgeon: Willis Modena, MD;  Location: [facility];  Service: Endoscopy;  Laterality: N/A;   ESOPHAGOGASTRODUODENOSCOPY (EGD) WITH PROPOFOL N/A 10/12/2020   Procedure: ESOPHAGOGASTRODUODENOSCOPY (EGD) WITH PROPOFOL;  Surgeon: Willis Modena, MD;  Location: [facility];  Service: Endoscopy;  Laterality: N/A;   LAPAROSCOPY Right 09/09/2015    Procedure: DIAGNOSTIC LAPAROSCOPY RIGHT CYST ASPIRATION AND CHROMOPERTUBATION;  Surgeon: Marcelle Overlie, MD;  Location: [facility];  Service: Gynecology;  Laterality: Right;   LEEP     TONSILLECTOMY     tubes in ears       OB History   No obstetric history on file.      Home Medications    Prior to Admission medications   Medication Sig Start Date End Date Taking? Authorizing Provider  norethindrone-ethinyl estradiol-FE (LOESTRIN FE) 1-20 MG-MCG tablet Take 1 tablet by mouth daily.   Yes [provider]  pantoprazole (PROTONIX) 40 MG tablet Take 1 tablet (40 mg total) by mouth daily. 07/17/23  Yes Particia Nearing, PA-C  sucralfate (CARAFATE) 1 g tablet Take 1 tablet (1 g total) by mouth 3 (three) times daily as needed. May dissolve 1 tablet into a glass of water and drink up to 3 times daily as needed prior to meals 07/17/23  Yes Particia Nearing, PA-C  acetaminophen (TYLENOL) 500 MG tablet Take 500 mg by mouth every 6 (six) hours as needed (pain).    [provider]  amphetamine-dextroamphetamine (ADDERALL) 10 MG tablet Take 10 mg by mouth daily as needed (attention).    [provider]  Cholecalciferol (VITAMIN D-3) 125 MCG (5000 UT) TABS Take 5,000 Units by mouth every evening.    [provider]  fluticasone (FLONASE) 50 MCG/ACT nasal spray Place 2 sprays into both nostrils daily. 10/31/22   Leath-Warren, Lorene Dy  J, NP  ibuprofen (ADVIL) 800 MG tablet Take 1 tablet (800 mg total) by mouth every 8 (eight) hours as needed. 12/27/22   Menshew, Charlesetta Ivory, PA-C  Multiple Vitamin (MULTIVITAMIN WITH MINERALS) TABS tablet Take 1 tablet by mouth every evening.    [provider]  ondansetron (ZOFRAN-ODT) 4 MG disintegrating tablet Take 1 tablet (4 mg total) by mouth every 8 (eight) hours as needed for nausea or vomiting. 12/27/22   Menshew, Charlesetta Ivory, PA-C  OZEMPIC, 1 MG/DOSE, 4 MG/3ML SOPN INJECT 1 MG UNDER THE SKIN ONE DAY A WEEK.     [provider]  promethazine-dextromethorphan (PROMETHAZINE-DM) 6.25-15 MG/5ML syrup Take 5 mLs by mouth 4 (four) times daily as needed for cough. 10/31/22   Leath-Warren, Sadie Haber, NP    Family History Family History  Problem Relation Age of Onset   Breast cancer Paternal Grandmother     Social History Social History   Tobacco Use   Smoking status: Former   Smokeless tobacco: Never  Substance Use Topics   Alcohol use: Yes    Comment: occ   Drug use: No     Allergies   Gluten meal   Review of Systems Review of Systems Per HPI  Physical Exam Triage Vital Signs ED Triage Vitals  Encounter Vitals Group     BP 07/17/23 0943 127/84     Systolic BP Percentile --      Diastolic BP Percentile --      Pulse Rate 07/17/23 0943 83     Resp 07/17/23 0943 18     Temp 07/17/23 0943 99.5 F (37.5 C)     Temp Source 07/17/23 0943 Oral     SpO2 07/17/23 0943 98 %     Weight --      Height --      Head Circumference --      Peak Flow --      Pain Score 07/17/23 1002 2     Pain Loc --      Pain Education --      Exclude from Growth Chart --    No data found.  Updated Vital Signs BP 127/84 (BP Location: Right Arm)   Pulse 83   Temp 99.5 F (37.5 C) (Oral)   Resp 18   LMP 07/07/2023 (Approximate)   SpO2 98%   Visual Acuity Right Eye Distance:   Left Eye Distance:   Bilateral Distance:    Right Eye Near:   Left Eye Near:    Bilateral Near:     Physical Exam Vitals and nursing note reviewed.  Constitutional:      Appearance: Normal appearance. She is not ill-appearing.  HENT:     Head: Atraumatic.     Mouth/Throat:     Mouth: Mucous membranes are moist.     Pharynx: Oropharynx is clear.  Eyes:     Extraocular Movements: Extraocular movements intact.     Conjunctiva/sclera: Conjunctivae normal.  Cardiovascular:     Rate and Rhythm: Normal rate and regular rhythm.     Heart sounds: Normal heart sounds.  Pulmonary:     Effort: Pulmonary effort  is normal.     Breath sounds: Normal breath sounds.  Abdominal:     General: Bowel sounds are normal. There is no distension.     Palpations: Abdomen is soft. There is no mass.     Tenderness: There is abdominal tenderness. There is no right CVA tenderness, left CVA tenderness, guarding or rebound.  Comments: Mild epigastric tenderness to palpation without distention or guarding.  Negative Murphy sign no organomegaly palpable  Musculoskeletal:        General: Normal range of motion.     Cervical back: Normal range of motion and neck supple.  Skin:    General: Skin is warm and dry.  Neurological:     Mental Status: She is alert and oriented to person, place, and time.  Psychiatric:        Mood and Affect: Mood normal.        Thought Content: Thought content normal.        Judgment: Judgment normal.    UC Treatments / Results  Labs (all labs ordered are listed, but only abnormal results are displayed) Labs Reviewed  POCT URINALYSIS DIP (MANUAL ENTRY) - Abnormal; Notable for the following components:      Result Value   Ketones, POC UA trace (5) (*)    Blood, UA large (*)    Protein Ur, POC trace (*)    All other components within normal limits  COMPREHENSIVE METABOLIC PANEL  LIPASE  CBC WITH DIFFERENTIAL/PLATELET   EKG  Radiology No results found.  Procedures Procedures (including critical care time)  Medications Ordered in UC Medications  alum & mag hydroxide-simeth (MAALOX/MYLANTA) 200-200-20 MG/5ML suspension 30 mL (30 mLs Oral Given 07/17/23 1030)  lidocaine (XYLOCAINE) 2 % viscous mouth solution 15 mL (15 mLs Mouth/Throat Given 07/17/23 1030)    Initial Impression / Assessment and Plan / UC Course  I have reviewed the triage vital signs and the nursing notes.  Pertinent labs & imaging results that were available during my care of the patient were reviewed by me and considered in my medical decision making (see chart for details).     Overall well-appearing  with normal vital signs.  Urinalysis without evidence of UTI.  Possibly gastritis.  Labs reviewed from yesterday via [facility] online portal on her phone, mildly elevated liver enzymes otherwise CBC and CMP within normal limits.  Given symptoms started at 2 AM this morning and lipase was not drawn, will repeat labs today for further evaluation.  GI cocktail given in clinic today, Protonix and Carafate, bland foods reviewed.  ED for significantly worsening symptoms.  Work note given.  Final Clinical Impressions(s) / UC Diagnoses   Final diagnoses:  Upper abdominal pain   Discharge Instructions   None    ED Prescriptions     Medication Sig Dispense Auth. Provider   pantoprazole (PROTONIX) 40 MG tablet Take 1 tablet (40 mg total) by mouth daily. 14 tablet Particia Nearing, [place]   sucralfate (CARAFATE) 1 g tablet Take 1 tablet (1 g total) by mouth 3 (three) times daily as needed. May dissolve 1 tablet into a glass of water and drink up to 3 times daily as needed prior to meals 60 tablet Particia Nearing, [place]      PDMP not reviewed this encounter.   Particia Nearing, [place] 07/17/23 1055

## 2023-07-17 NOTE — ED Notes (Signed)
RT attempted venipuncture x1. RN attempted venipuncture x2. Pt requested additional try as reported " I don't live close by and I would really prefer to get everything done today." "Lets try one more time and then I will come back if not able to get it then."   Obtained blood sample. Pt tolerated well.

## 2023-07-17 NOTE — ED Triage Notes (Signed)
Pt reports abdominal pain and nausea since this am. Pt reports took zofran and mylanta. Reports felt improvement after that and was able to have BM.

## 2023-07-18 LAB — COMPREHENSIVE METABOLIC PANEL
ALT: 119 [IU]/L — ABNORMAL HIGH (ref 0–32)
AST: 74 [IU]/L — ABNORMAL HIGH (ref 0–40)
Albumin: 4.3 g/dL (ref 3.9–4.9)
Alkaline Phosphatase: 84 [IU]/L (ref 44–121)
BUN/Creatinine Ratio: 7 — ABNORMAL LOW (ref 9–23)
BUN: 5 mg/dL — ABNORMAL LOW (ref 6–20)
Bilirubin Total: <0.2 mg/dL (ref 0.0–1.2)
CO2: 16 mmol/L — ABNORMAL LOW (ref 20–29)
Calcium: 9.3 mg/dL (ref 8.7–10.2)
Chloride: 108 mmol/L — ABNORMAL HIGH (ref 96–106)
Creatinine, Ser: 0.69 mg/dL (ref 0.57–1.00)
Globulin, Total: 2.9 g/dL (ref 1.5–4.5)
Glucose: 101 mg/dL — ABNORMAL HIGH (ref 70–99)
Potassium: 4.7 mmol/L (ref 3.5–5.2)
Sodium: 139 mmol/L (ref 134–144)
Total Protein: 7.2 g/dL (ref 6.0–8.5)
eGFR: 115 mL/min/{1.73_m2} (ref >59)

## 2023-07-18 LAB — CBC WITH DIFFERENTIAL/PLATELET
Basophils Absolute: 0.1 10*3/uL (ref 0.0–0.2)
Basos: 0 %
EOS (ABSOLUTE): 0.1 10*3/uL (ref 0.0–0.4)
Eos: 0 %
Hematocrit: 45.5 % (ref 34.0–46.6)
Hemoglobin: 15 g/dL (ref 11.1–15.9)
Immature Grans (Abs): 0 10*3/uL (ref 0.0–0.1)
Immature Granulocytes: 0 %
Lymphocytes Absolute: 1.7 10*3/uL (ref 0.7–3.1)
Lymphs: 13 %
MCH: 28.7 pg (ref 26.6–33.0)
MCHC: 33 g/dL (ref 31.5–35.7)
MCV: 87 fL (ref 79–97)
Monocytes Absolute: 0.5 10*3/uL (ref 0.1–0.9)
Monocytes: 4 %
Neutrophils Absolute: 10.7 10*3/uL — ABNORMAL HIGH (ref 1.4–7.0)
Neutrophils: 83 %
Platelets: 341 10*3/uL (ref 150–450)
RBC: 5.22 x10E6/uL (ref 3.77–5.28)
RDW: 13.5 % (ref 11.7–15.4)
WBC: 12.9 10*3/uL — ABNORMAL HIGH (ref 3.4–10.8)

## 2023-07-18 LAB — LIPASE: Lipase: 42 U/L (ref 14–72)

## 2023-08-10 LAB — HM HEPATITIS C SCREENING LAB: HM Hepatitis Screen: NEGATIVE

## 2023-08-12 LAB — HM PAP SMEAR: HM Pap smear: NEGATIVE

## 2023-08-14 ENCOUNTER — Other Ambulatory Visit: Payer: Self-pay | Admitting: Internal Medicine

## 2023-08-14 DIAGNOSIS — R748 Abnormal levels of other serum enzymes: Secondary | ICD-10-CM

## 2023-08-14 DIAGNOSIS — R1011 Right upper quadrant pain: Secondary | ICD-10-CM

## 2023-08-19 ENCOUNTER — Ambulatory Visit
Admission: RE | Admit: 2023-08-19 | Discharge: 2023-08-19 | Disposition: A | Discharge status: Home / Self Care | Payer: Managed Care, Other (non HMO) | Source: Ambulatory Visit | Attending: Internal Medicine | Admitting: Internal Medicine

## 2023-08-19 DIAGNOSIS — R748 Abnormal levels of other serum enzymes: Secondary | ICD-10-CM

## 2023-08-19 DIAGNOSIS — R1011 Right upper quadrant pain: Secondary | ICD-10-CM

## 2023-09-10 ENCOUNTER — Encounter (HOSPITAL_COMMUNITY): Payer: Self-pay | Admitting: Pharmacy Technician

## 2023-09-10 ENCOUNTER — Emergency Department (HOSPITAL_COMMUNITY): Payer: Managed Care, Other (non HMO)

## 2023-09-10 ENCOUNTER — Other Ambulatory Visit: Payer: Self-pay

## 2023-09-10 ENCOUNTER — Observation Stay (HOSPITAL_COMMUNITY)
Admission: EM | Admit: 2023-09-10 | Discharge: 2023-09-13 | Disposition: A | Discharge status: Home / Self Care | Payer: Managed Care, Other (non HMO) | Attending: Emergency Medicine | Admitting: Emergency Medicine

## 2023-09-10 DIAGNOSIS — R109 Unspecified abdominal pain: Secondary | ICD-10-CM | POA: Diagnosis present

## 2023-09-10 DIAGNOSIS — K810 Acute cholecystitis: Principal | ICD-10-CM | POA: Diagnosis present

## 2023-09-10 DIAGNOSIS — K8012 Calculus of gallbladder with acute and chronic cholecystitis without obstruction: Principal | ICD-10-CM | POA: Insufficient documentation

## 2023-09-10 LAB — CBC
HCT: 47.7 % — ABNORMAL HIGH (ref 36.0–46.0)
Hemoglobin: 15.4 g/dL — ABNORMAL HIGH (ref 12.0–15.0)
MCH: 27.6 pg (ref 26.0–34.0)
MCHC: 32.3 g/dL (ref 30.0–36.0)
MCV: 85.5 fL (ref 80.0–100.0)
Platelets: 379 10*3/uL (ref 150–400)
RBC: 5.58 MIL/uL — ABNORMAL HIGH (ref 3.87–5.11)
RDW: 13.6 % (ref 11.5–15.5)
WBC: 18.2 10*3/uL — ABNORMAL HIGH (ref 4.0–10.5)
nRBC: 0 % (ref 0.0–0.2)

## 2023-09-10 LAB — COMPREHENSIVE METABOLIC PANEL
ALT: 22 U/L (ref 0–44)
AST: 28 U/L (ref 15–41)
Albumin: 3.8 g/dL (ref 3.5–5.0)
Alkaline Phosphatase: 81 U/L (ref 38–126)
Anion gap: 12 (ref 5–15)
BUN: 6 mg/dL (ref 6–20)
CO2: 19 mmol/L — ABNORMAL LOW (ref 22–32)
Calcium: 9.6 mg/dL (ref 8.9–10.3)
Chloride: 104 mmol/L (ref 98–111)
Creatinine, Ser: 0.83 mg/dL (ref 0.44–1.00)
GFR, Estimated: >60 mL/min (ref >60)
Glucose, Bld: 102 mg/dL — ABNORMAL HIGH (ref 70–99)
Potassium: 4 mmol/L (ref 3.5–5.1)
Sodium: 135 mmol/L (ref 135–145)
Total Bilirubin: 0.5 mg/dL (ref 0.0–1.2)
Total Protein: 8.1 g/dL (ref 6.5–8.1)

## 2023-09-10 LAB — LIPASE, BLOOD: Lipase: 34 U/L (ref 11–51)

## 2023-09-10 LAB — HCG, SERUM, QUALITATIVE: Preg, Serum: NEGATIVE

## 2023-09-10 MED ORDER — ONDANSETRON 4 MG PO TBDP
4.0000 mg | ORAL_TABLET | Freq: Once | ORAL | Status: AC | PRN
  Administered 2023-09-10: 4 mg via ORAL

## 2023-09-10 MED ORDER — ACETAMINOPHEN 500 MG PO TABS
1000.0000 mg | ORAL_TABLET | Freq: Once | ORAL | Status: AC
  Administered 2023-09-10: 1000 mg via ORAL
  Filled 2023-09-10: qty 2

## 2023-09-10 MED ORDER — IOHEXOL 350 MG/ML SOLN
75.0000 mL | Freq: Once | INTRAVENOUS | Status: AC | PRN
  Administered 2023-09-10: 75 mL via INTRAVENOUS

## 2023-09-10 NOTE — ED Triage Notes (Signed)
 Pt here with sudden onset central upper abdominal pain with nausea since 0400 today. States has had several episodes like this in the past, but they eased up on their own. Hx gallstones.

## 2023-09-10 NOTE — ED Notes (Signed)
 Per pt, pt was given tylenol in triage. Pt threw up tylenol and now has c/o pain. RN made aware.

## 2023-09-10 NOTE — ED Provider Triage Note (Signed)
 Emergency Medicine Provider Triage Evaluation Note  Angelica Alvarado , a 38 y.o. female  was evaluated in triage.  Pt complains of upper abd pain that started at 0400 this morning. One episode of vomiting in triage. Last BM this morning  Gallstones with surgical consult for cholecystectomy 09/05/23  Review of Systems  Positive: Upper abd pain, nausea, vomiting Negative: fevers  Physical Exam  BP (!) 144/94 (BP Location: Right Arm)   Pulse 72   Temp 98.1 F (36.7 C)   Resp 16   SpO2 97%  Gen:   Awake, no distress   Resp:  Normal effort  MSK:   Moves extremities without difficulty  Other:    Medical Decision Making  Medically screening exam initiated at 5:50 PM.  Appropriate orders placed.  Angelica Alvarado was informed that the remainder of the evaluation will be completed by another provider, this initial triage assessment does not replace that evaluation, and the importance of remaining in the ED until their evaluation is complete.  Labs and imaging ordered   Angelica Alvarado, Angelica Alvarado 09/10/23 1755

## 2023-09-11 DIAGNOSIS — K810 Acute cholecystitis: Principal | ICD-10-CM | POA: Diagnosis present

## 2023-09-11 LAB — COMPREHENSIVE METABOLIC PANEL
ALT: 29 U/L (ref 0–44)
AST: 30 U/L (ref 15–41)
Albumin: 3.6 g/dL (ref 3.5–5.0)
Alkaline Phosphatase: 73 U/L (ref 38–126)
Anion gap: 12 (ref 5–15)
BUN: 9 mg/dL (ref 6–20)
CO2: 23 mmol/L (ref 22–32)
Calcium: 9.7 mg/dL (ref 8.9–10.3)
Chloride: 100 mmol/L (ref 98–111)
Creatinine, Ser: 0.98 mg/dL (ref 0.44–1.00)
GFR, Estimated: >60 mL/min (ref >60)
Glucose, Bld: 96 mg/dL (ref 70–99)
Potassium: 3.9 mmol/L (ref 3.5–5.1)
Sodium: 135 mmol/L (ref 135–145)
Total Bilirubin: 0.8 mg/dL (ref 0.0–1.2)
Total Protein: 7.6 g/dL (ref 6.5–8.1)

## 2023-09-11 LAB — CBC WITH DIFFERENTIAL/PLATELET
Abs Immature Granulocytes: 0.08 10*3/uL — ABNORMAL HIGH (ref 0.00–0.07)
Basophils Absolute: 0.1 10*3/uL (ref 0.0–0.1)
Basophils Relative: 0 %
Eosinophils Absolute: 0.1 10*3/uL (ref 0.0–0.5)
Eosinophils Relative: 0 %
HCT: 45.5 % (ref 36.0–46.0)
Hemoglobin: 15.1 g/dL — ABNORMAL HIGH (ref 12.0–15.0)
Immature Granulocytes: 1 %
Lymphocytes Relative: 15 %
Lymphs Abs: 2.5 10*3/uL (ref 0.7–4.0)
MCH: 28.4 pg (ref 26.0–34.0)
MCHC: 33.2 g/dL (ref 30.0–36.0)
MCV: 85.5 fL (ref 80.0–100.0)
Monocytes Absolute: 1 10*3/uL (ref 0.1–1.0)
Monocytes Relative: 6 %
Neutro Abs: 13.1 10*3/uL — ABNORMAL HIGH (ref 1.7–7.7)
Neutrophils Relative %: 78 %
Platelets: 387 10*3/uL (ref 150–400)
RBC: 5.32 MIL/uL — ABNORMAL HIGH (ref 3.87–5.11)
RDW: 13.8 % (ref 11.5–15.5)
WBC: 16.8 10*3/uL — ABNORMAL HIGH (ref 4.0–10.5)
nRBC: 0 % (ref 0.0–0.2)

## 2023-09-11 LAB — LACTIC ACID, PLASMA
Lactic Acid, Venous: 1.6 mmol/L (ref 0.5–1.9)
Lactic Acid, Venous: 1.7 mmol/L (ref 0.5–1.9)

## 2023-09-11 LAB — HIV ANTIBODY (ROUTINE TESTING W REFLEX): HIV Screen 4th Generation wRfx: NONREACTIVE

## 2023-09-11 MED ORDER — DIPHENHYDRAMINE HCL 25 MG PO CAPS: 25.0000 mg | ORAL_CAPSULE | Freq: Four times a day (QID) | ORAL | Status: DC | PRN

## 2023-09-11 MED ORDER — POLYETHYLENE GLYCOL 3350 17 G PO PACK
17.0000 g | PACK | Freq: Every day | ORAL | Status: DC | PRN
  Administered 2023-09-12: 17 g via ORAL
  Filled 2023-09-11: qty 1

## 2023-09-11 MED ORDER — ONDANSETRON 4 MG PO TBDP: 4.0000 mg | ORAL_TABLET | Freq: Four times a day (QID) | ORAL | Status: DC | PRN

## 2023-09-11 MED ORDER — MELATONIN 3 MG PO TABS: 3.0000 mg | ORAL_TABLET | Freq: Every evening | ORAL | Status: DC | PRN

## 2023-09-11 MED ORDER — SODIUM CHLORIDE 0.9 % IV SOLN
2.0000 g | INTRAVENOUS | Status: DC
  Administered 2023-09-11: 2 g via INTRAVENOUS
  Filled 2023-09-11: qty 20

## 2023-09-11 MED ORDER — ENOXAPARIN SODIUM 40 MG/0.4ML IJ SOSY
40.0000 mg | PREFILLED_SYRINGE | INTRAMUSCULAR | Status: DC
  Administered 2023-09-11 – 2023-09-12 (×2): 40 mg via SUBCUTANEOUS
  Filled 2023-09-11 (×2): qty 0.4

## 2023-09-11 MED ORDER — METOPROLOL TARTRATE 5 MG/5ML IV SOLN
5.0000 mg | Freq: Four times a day (QID) | INTRAVENOUS | Status: DC | PRN
  Administered 2023-09-12: 5 mg via INTRAVENOUS

## 2023-09-11 MED ORDER — OXYCODONE HCL 5 MG PO TABS
5.0000 mg | ORAL_TABLET | ORAL | Status: DC | PRN
  Administered 2023-09-12 (×2): 10 mg via ORAL
  Administered 2023-09-13: 5 mg via ORAL
  Filled 2023-09-11 (×2): qty 2
  Filled 2023-09-11: qty 1

## 2023-09-11 MED ORDER — ACETAMINOPHEN 500 MG PO TABS
1000.0000 mg | ORAL_TABLET | Freq: Four times a day (QID) | ORAL | Status: DC
  Administered 2023-09-11 – 2023-09-13 (×7): 1000 mg via ORAL
  Filled 2023-09-11 (×7): qty 2

## 2023-09-11 MED ORDER — DIPHENHYDRAMINE HCL 50 MG/ML IJ SOLN: 25.0000 mg | Freq: Four times a day (QID) | INTRAMUSCULAR | Status: DC | PRN

## 2023-09-11 MED ORDER — METHOCARBAMOL 500 MG PO TABS
500.0000 mg | ORAL_TABLET | Freq: Three times a day (TID) | ORAL | Status: DC | PRN
  Administered 2023-09-12 – 2023-09-13 (×3): 500 mg via ORAL
  Filled 2023-09-11 (×3): qty 1

## 2023-09-11 MED ORDER — HYDRALAZINE HCL 20 MG/ML IJ SOLN
10.0000 mg | INTRAMUSCULAR | Status: DC | PRN
Start: 2023-09-11 — End: 2023-09-13

## 2023-09-11 MED ORDER — PIPERACILLIN-TAZOBACTAM 3.375 G IVPB 30 MIN
3.3750 g | Freq: Once | INTRAVENOUS | Status: DC
  Filled 2023-09-11: qty 50

## 2023-09-11 MED ORDER — ONDANSETRON HCL 4 MG/2ML IJ SOLN
4.0000 mg | Freq: Four times a day (QID) | INTRAMUSCULAR | Status: DC | PRN
  Filled 2023-09-11: qty 2

## 2023-09-11 MED ORDER — SIMETHICONE 80 MG PO CHEW: 40.0000 mg | CHEWABLE_TABLET | Freq: Four times a day (QID) | ORAL | Status: DC | PRN

## 2023-09-11 MED ORDER — METHOCARBAMOL 1000 MG/10ML IJ SOLN
500.0000 mg | Freq: Three times a day (TID) | INTRAMUSCULAR | Status: DC | PRN
Start: 2023-09-11 — End: 2023-09-13

## 2023-09-11 NOTE — ED Notes (Signed)
 Urine cup provided to patient for a sample.

## 2023-09-11 NOTE — ED Provider Notes (Signed)
 [place] [facility]PARTMENT AT [facility] Provider Note   CSN: [id number] Arrival date & time: 09/10/23  1430     History  Chief Complaint  Patient presents with   Abdominal Pain    Angelica Alvarado is a 38 y.o. female.  With a history of gallstones, obesity and PCOS who presents to the ED for abdominal pain.  Pain began early yesterday morning around 0400 that awoke her from sleep.  Has been in the emergency department for nearly 24 hours awaiting evaluation.  Pain localized over the epigastric region and right upper quadrant with associated nausea and vomiting shaking chills.  Was recently seen in general surgery office on January 9 with plan for elective cholecystectomy.  No chest pain, shortness of breath, fevers or other complaints at this time.  Last meal was on the night of January 13   Abdominal Pain      Home Medications Prior to Admission medications   Medication Sig Start Date End Date Taking? Authorizing Provider  acetaminophen  (TYLENOL ) 500 MG tablet Take 500 mg by mouth every 6 (six) hours as needed (pain).   Yes [provider]  amphetamine-dextroamphetamine (ADDERALL) 10 MG tablet Take 10 mg by mouth daily as needed (attention).   Yes [provider]  clobetasol cream (TEMOVATE) 0.05 % Apply 1 Application topically daily as needed (hand rash).   Yes [provider]  Multiple Vitamin (MULTIVITAMIN WITH MINERALS) TABS tablet Take 1 tablet by mouth every evening.   Yes [provider]  nystatin-triamcinolone (MYCOLOG II) cream Apply 1 Application topically daily as needed (for rash).   Yes [provider]  OZEMPIC, 1 MG/DOSE, 4 MG/3ML SOPN Inject 2 mg into the skin once a week.   Yes [provider]  polyethylene glycol (MIRALAX  / GLYCOLAX ) 17 g packet Take 17 g by mouth daily as needed for mild constipation.   Yes [provider]  Cholecalciferol (VITAMIN D-3) 125 MCG (5000 UT) TABS  Take 5,000 Units by mouth every evening.   Yes [provider]  fluticasone  (FLONASE ) 50 MCG/ACT nasal spray Place 2 sprays into both nostrils daily. Patient taking differently: Place 2 sprays into both nostrils daily as needed for allergies. 10/31/22  Yes Leath-Warren, Belen Bowers, NP  norethindrone-ethinyl estradiol-FE (LOESTRIN FE) 1-20 MG-MCG tablet Take 1 tablet by mouth daily.   Yes [provider]  ondansetron  (ZOFRAN -ODT) 4 MG disintegrating tablet Take 1 tablet (4 mg total) by mouth every 8 (eight) hours as needed for nausea or vomiting. 12/27/22  Yes Menshew, Raye Cai, PA-C  sucralfate  (CARAFATE ) 1 g tablet Take 1 tablet (1 g total) by mouth 3 (three) times daily as needed. May dissolve 1 tablet into a glass of water and drink up to 3 times daily as needed prior to meals Patient taking differently: Take 1 g by mouth 3 (three) times daily between meals as needed (for gas pain per patient). 07/17/23  Yes Corbin Dess, PA-C      Allergies    Gluten meal    Review of Systems   Review of Systems  Gastrointestinal:  Positive for abdominal pain.    Physical Exam Updated Vital Signs BP (!) 131/93 (BP Location: Right Arm)   Pulse 71   Temp 98.8 F (37.1 C) (Oral)   Resp 20   SpO2 100%  Physical Exam Vitals and nursing note reviewed.  Constitutional:      Comments: Obese  HENT:     Head: Normocephalic  and atraumatic.  Eyes:     Pupils: Pupils are equal, round, and reactive to light.  Cardiovascular:     Rate and Rhythm: Normal rate and regular rhythm.  Pulmonary:     Effort: Pulmonary effort is normal.     Breath sounds: Normal breath sounds.  Abdominal:     Palpations: Abdomen is soft.     Tenderness: There is no abdominal tenderness.     Comments: Epigastric and right upper quadrant tenderness without rebound rigidity or guarding  Skin:    General: Skin is warm and dry.  Neurological:     Mental Status: She is alert.  Psychiatric:         Mood and Affect: Mood normal.     ED Results / Procedures / Treatments   Labs (all labs ordered are listed, but only abnormal results are displayed) Labs Reviewed  COMPREHENSIVE METABOLIC PANEL - Abnormal; Notable for the following components:      Result Value   CO2 19 (*)    Glucose, Bld 102 (*)    All other components within normal limits  CBC - Abnormal; Notable for the following components:   WBC 18.2 (*)    RBC 5.58 (*)    Hemoglobin 15.4 (*)    HCT 47.7 (*)    All other components within normal limits  CULTURE, BLOOD (ROUTINE X 2)  CULTURE, BLOOD (ROUTINE X 2)  LIPASE, BLOOD  HCG, SERUM, QUALITATIVE  URINALYSIS, ROUTINE W REFLEX MICROSCOPIC  COMPREHENSIVE METABOLIC PANEL  CBC WITH DIFFERENTIAL/PLATELET  LACTIC ACID, PLASMA  LACTIC ACID, PLASMA  HIV ANTIBODY (ROUTINE TESTING W REFLEX)    EKG None  Radiology CT ABDOMEN PELVIS W CONTRAST Result Date: 09/10/2023 CLINICAL DATA:  Upper abdominal pain with nausea EXAM: CT ABDOMEN AND PELVIS WITH CONTRAST TECHNIQUE: Multidetector CT imaging of the abdomen and pelvis was performed using the standard protocol following bolus administration of intravenous contrast. RADIATION DOSE REDUCTION: This exam was performed according to the departmental dose-optimization program which includes automated exposure control, adjustment of the mA and/or kV according to patient size and/or use of iterative reconstruction technique. CONTRAST:  75mL OMNIPAQUE  IOHEXOL  350 MG/ML SOLN COMPARISON:  None Available. FINDINGS: Lower chest: No acute abnormality. Hepatobiliary: Slightly distended gallbladder. Multiple stones. 3 cm lamellated stone at the neck of the gallbladder. No inflammation Pancreas: Unremarkable. No pancreatic ductal dilatation or surrounding inflammatory changes. Spleen: Normal in size without focal abnormality. Adrenals/Urinary Tract: Adrenal glands are unremarkable. Kidneys are normal, without renal calculi, focal lesion, or  hydronephrosis. Bladder is unremarkable. Stomach/Bowel: Stomach is within normal limits. Appendix appears normal. No evidence of bowel wall thickening, distention, or inflammatory changes. Vascular/Lymphatic: Nonaneurysmal aorta.  No suspicious lymph nodes. Reproductive: Uterus unremarkable.  7 x 6.9 cm right ovarian cyst Other: Negative for pelvic effusion or free air Musculoskeletal: No acute or suspicious osseous abnormality IMPRESSION: 1. Cholelithiasis with 3 cm lamellated stone at the neck of the gallbladder. Slightly distended gallbladder but no inflammation. Suggest correlation with ultrasound 2. 7 cm right ovarian cyst. Because this lesion is not adequately characterized, prompt US  is recommended for further evaluation. Note: This recommendation does not apply to premenarchal patients and to those with increased risk (genetic, family history, elevated tumor markers or other high-risk factors) of ovarian cancer. Reference: JACR 2020 Feb; 17(2):248-254 Electronically Signed   By: Esmeralda Hedge M.D.   On: 09/10/2023 20:02    Procedures Procedures    Medications Ordered in ED Medications  enoxaparin  (LOVENOX ) injection 40 mg (has  no administration in time range)  cefTRIAXone  (ROCEPHIN ) 2 g in sodium chloride  0.9 % 100 mL IVPB (has no administration in time range)  acetaminophen  (TYLENOL ) tablet 1,000 mg (has no administration in time range)  oxyCODONE  (Oxy IR/ROXICODONE ) immediate release tablet 5-10 mg (has no administration in time range)  methocarbamol  (ROBAXIN ) tablet 500 mg (has no administration in time range)    Or  methocarbamol  (ROBAXIN ) injection 500 mg (has no administration in time range)  diphenhydrAMINE  (BENADRYL ) capsule 25 mg (has no administration in time range)    Or  diphenhydrAMINE  (BENADRYL ) injection 25 mg (has no administration in time range)  melatonin tablet 3 mg (has no administration in time range)  polyethylene glycol (MIRALAX  / GLYCOLAX ) packet 17 g (has no  administration in time range)  ondansetron  (ZOFRAN -ODT) disintegrating tablet 4 mg (has no administration in time range)    Or  ondansetron  (ZOFRAN ) injection 4 mg (has no administration in time range)  simethicone  (MYLICON) chewable tablet 40 mg (has no administration in time range)  hydrALAZINE  (APRESOLINE ) injection 10 mg (has no administration in time range)  metoprolol  tartrate (LOPRESSOR ) injection 5 mg (has no administration in time range)  ondansetron  (ZOFRAN -ODT) disintegrating tablet 4 mg (4 mg Oral Given 09/10/23 1711)  acetaminophen  (TYLENOL ) tablet 1,000 mg (1,000 mg Oral Given 09/10/23 1906)  iohexol  (OMNIPAQUE ) 350 MG/ML injection 75 mL (75 mLs Intravenous Contrast Given 09/10/23 1859)    ED Course/ Medical Decision Making/ A&P Clinical Course as of 09/11/23 1516  Wed Sep 11, 2023  1516 Patient has been evaluated by surgery team here.  She will be admitted to surgery service with plan for cholecystectomy tomorrow.  She has remained stable at this time [MP]    Clinical Course User Index [MP] Sallyanne Creamer, DO                                 Medical Decision Making 38 year old female with history as above known gallstones with plan for upcoming elective cholecystectomy presents for abdominal pain.  Epigastric pain right upper quadrant pain nausea vomiting and chills.  Came in yesterday morning and has been awaiting evaluation for over 23 hours.  Recently seen by general surgery in the office with plan for elective cholecystectomy.  Epigastric and right upper quadrant tenderness on exam laboratory workup reveals leukocytosis with no elevation in LFTs. CT abdomen pelvis shows cholelithiasis and slightly distended gallbladder without inflammatory changes 7 cm right ovarian cyst.  Presentation most concerning with biliary colic.  Will discuss with surgery  Amount and/or Complexity of Data Reviewed Labs: ordered.  Risk Prescription drug management. Decision regarding  hospitalization.           Final Clinical Impression(s) / ED Diagnoses Final diagnoses:  Acute cholecystitis    Rx / DC Orders ED Discharge Orders     None         Sallyanne Creamer, DO 09/11/23 1516

## 2023-09-11 NOTE — H&P (Signed)
 Angelica Alvarado May 05, 1986  [id number].    Requesting MD: Dr. Rafael Bun Chief Complaint/Reason for Consult: Cholecystitis   HPI: Angelica Alvarado is a 38 y.o. who presented to the ED for abdominal pain. Patient has a known hx of Symptomatic Cholelithiasis, was seen in the office last week by Dr. Aniceto Barley for this and is being arranged for outpatient Cholecystectomy. She presented today with excerebration of her epigastric/ruq pain similar to prior episodes. This episodes however has lasted longer than her normal episodes. Notes some associated nausea. No vomiting. Her w/u was significant for wbc 18.2, normal lft's, normal lipase and CT w/ 3cm stone in the neck of the gallbladder without inflammatory changes. Patient is on Ozempic with last dose Sunday.   Past Medical History: Obesity Prior Abdominal Surgeries: Laparoscopic GYN surgery Blood Thinners: None Tobacco Use: None Employment: [employer]   ROS: ROS As above, see hpi  Family History  Problem Relation Age of Onset   Breast cancer Paternal Grandmother     Past Medical History:  Diagnosis Date   Celiac disease    Polycystic ovarian disease    PONV (postoperative nausea and vomiting)     Past Surgical History:  Procedure Laterality Date   BIOPSY  10/12/2020   Procedure: BIOPSY;  Surgeon: Evangeline Hilts, MD;  Location: [facility];  Service: Endoscopy;;  EGD and COLON   COLONOSCOPY WITH PROPOFOL  N/A 10/12/2020   Procedure: COLONOSCOPY WITH PROPOFOL ;  Surgeon: Evangeline Hilts, MD;  Location: [facility];  Service: Endoscopy;  Laterality: N/A;   ESOPHAGOGASTRODUODENOSCOPY (EGD) WITH PROPOFOL  N/A 10/12/2020   Procedure: ESOPHAGOGASTRODUODENOSCOPY (EGD) WITH PROPOFOL ;  Surgeon: Evangeline Hilts, MD;  Location: [facility];  Service: Endoscopy;  Laterality: N/A;   LAPAROSCOPY Right 09/09/2015   Procedure: DIAGNOSTIC LAPAROSCOPY RIGHT CYST ASPIRATION AND CHROMOPERTUBATION;  Surgeon: Thurman Flores, MD;   Location: [facility];  Service: Gynecology;  Laterality: Right;   LEEP     TONSILLECTOMY     tubes in ears       Social History:  reports that she has quit smoking. She has never used smokeless tobacco. She reports current alcohol use. She reports that she does not use drugs.  Allergies:  Allergies  Allergen Reactions   Gluten Meal Other (See Comments)    (Not in a hospital admission)    Physical Exam: Blood pressure (!) 131/93, pulse 71, temperature 98.8 F (37.1 C), temperature source Oral, resp. rate 20, SpO2 100%. General: pleasant, WD/WN female who is laying in bed in NAD HEENT: head is normocephalic, atraumatic.  Heart: regular rate.   Lungs: Respiratory effort nonlabored Abd:  Soft, epigastric and ruq ttp. Psych: A&Ox4 with an appropriate affect  Results for orders placed or performed during the hospital encounter of 09/10/23 (from the past 48 hours)  Lipase, blood     Status: None   Collection Time: 09/10/23  5:23 PM  Result Value Ref Range   Lipase 34 11 - 51 U/L    Comment: Performed at [facility], [address], [place], [place] [address]  Comprehensive metabolic panel     Status: Abnormal   Collection Time: 09/10/23  5:23 PM  Result Value Ref Range   Sodium 135 135 - 145 mmol/L   Potassium 4.0 3.5 - 5.1 mmol/L   Chloride 104 98 - 111 mmol/L   CO2 19 (L) 22 - 32 mmol/L   Glucose, Bld 102 (H) 70 - 99 mg/dL    Comment: Glucose reference range applies  only to samples taken after fasting for at least 8 hours.   BUN 6 6 - 20 mg/dL   Creatinine, Ser 1.61 0.44 - 1.00 mg/dL   Calcium 9.6 8.9 - 09.6 mg/dL   Total Protein 8.1 6.5 - 8.1 g/dL   Albumin 3.8 3.5 - 5.0 g/dL   AST 28 15 - 41 U/L   ALT 22 0 - 44 U/L   Alkaline Phosphatase 81 38 - 126 U/L   Total Bilirubin 0.5 0.0 - 1.2 mg/dL   GFR, Estimated >04 >54 mL/min    Comment: (NOTE) Calculated using the CKD-EPI Creatinine Equation (2021)    Anion gap 12 5 - 15    Comment: Performed at [facility], [address] [place], [place] [address]  CBC     Status: Abnormal   Collection Time: 09/10/23  5:23 PM  Result Value Ref Range   WBC 18.2 (H) 4.0 - 10.5 K/uL   RBC 5.58 (H) 3.87 - 5.11 MIL/uL   Hemoglobin 15.4 (H) 12.0 - 15.0 g/dL   HCT 91.4 (H) 78.2 - 95.6 %   MCV 85.5 80.0 - 100.0 fL   MCH 27.6 26.0 - 34.0 pg   MCHC 32.3 30.0 - 36.0 g/dL   RDW 21.3 08.6 - 57.8 %   Platelets 379 150 - 400 K/uL   nRBC 0.0 0.0 - 0.2 %    Comment: Performed at [facility], [address], [place], [place] [address]  hCG, serum, qualitative     Status: None   Collection Time: 09/10/23  5:23 PM  Result Value Ref Range   Preg, Serum NEGATIVE NEGATIVE    Comment:        THE SENSITIVITY OF THIS METHODOLOGY IS >10 mIU/mL. Performed at [facility], [address] [place], [place] [address]    CT ABDOMEN PELVIS W CONTRAST Result Date: 09/10/2023 CLINICAL DATA:  Upper abdominal pain with nausea EXAM: CT ABDOMEN AND PELVIS WITH CONTRAST TECHNIQUE: Multidetector CT imaging of the abdomen and pelvis was performed using the standard protocol following bolus administration of intravenous contrast. RADIATION DOSE REDUCTION: This exam was performed according to the departmental dose-optimization program which includes automated exposure control, adjustment of the mA and/or kV according to patient size and/or use of iterative reconstruction technique. CONTRAST:  75mL OMNIPAQUE  IOHEXOL  350 MG/ML SOLN COMPARISON:  None Available. FINDINGS: Lower chest: No acute abnormality. Hepatobiliary: Slightly distended gallbladder. Multiple stones. 3 cm lamellated stone at the neck of the gallbladder. No inflammation Pancreas: Unremarkable. No pancreatic ductal dilatation or surrounding inflammatory changes. Spleen: Normal in size without focal abnormality. Adrenals/Urinary Tract: Adrenal glands are unremarkable. Kidneys are normal, without renal calculi, focal lesion, or hydronephrosis. Bladder is unremarkable.  Stomach/Bowel: Stomach is within normal limits. Appendix appears normal. No evidence of bowel wall thickening, distention, or inflammatory changes. Vascular/Lymphatic: Nonaneurysmal aorta.  No suspicious lymph nodes. Reproductive: Uterus unremarkable.  7 x 6.9 cm right ovarian cyst Other: Negative for pelvic effusion or free air Musculoskeletal: No acute or suspicious osseous abnormality IMPRESSION: 1. Cholelithiasis with 3 cm lamellated stone at the neck of the gallbladder. Slightly distended gallbladder but no inflammation. Suggest correlation with ultrasound 2. 7 cm right ovarian cyst. Because this lesion is not adequately characterized, prompt US  is recommended for further evaluation. Note: This recommendation does not apply to premenarchal patients and to those with increased risk (genetic, family history, elevated tumor markers or other high-risk factors) of ovarian cancer. Reference: JACR 2020 Feb; 17(2):248-254  Electronically Signed   By: Esmeralda Hedge M.D.   On: 09/10/2023 20:02    Anti-infectives (From admission, onward)    Start     Dose/Rate Route Frequency Ordered Stop   09/11/23 1330  piperacillin -tazobactam (ZOSYN ) IVPB 3.375 g        3.375 g 100 mL/hr over 30 Minutes Intravenous  Once 09/11/23 1328         Assessment/Plan Symptomatic Cholelithiasis, likely Cholecystitis  - This is a 38 y.o. female with a hx of Symptomatic Cholelithiasis that was seen in the office last week by Dr. Aniceto Barley and is being arranged for outpatient Cholecystectomy. She presented today with excerebration of her epigastric/ruq pain which has lasted longer than her normal episodes of abdominal pain. Her w/u was significant for wbc 18.2, normal lft's, normal lipase and CT w/ 3cm stone in the neck of the gallbladder without inflammatory changes. She does have upper abdominal ttp. Recommend admission for IV abx and Laparoscopic Cholecystectomy in the AM. Patient is agreeable to this.   FEN - CLD, NPO at  midnight VTE - SCDs, Lovenox  ID - Rocephin  Foley - None Dispo - Admit to observation, med-surg.   I reviewed nursing notes, last 24 h vitals and pain scores, last 48 h intake and output, last 24 h labs and trends, and last 24 h imaging results.   Delton Filbert, [facility] Surgery 09/11/2023, 2:23 PM Please see Amion for pager number during day hours 7:00am-4:30pm

## 2023-09-11 NOTE — Anesthesia Preprocedure Evaluation (Addendum)
Anesthesia Evaluation  Patient identified by MRN, date of birth, ID band Patient awake    Reviewed: Allergy & Precautions, NPO status , Patient's Chart, lab work & pertinent test results  History of Anesthesia Complications (+) PONV and history of anesthetic complications  Airway Mallampati: I  TM Distance: >3 FB Neck ROM: Full   Comment: Previous grade I view with Miller 2, easy mask Dental  (+) Dental Advisory Given   Pulmonary neg shortness of breath, neg sleep apnea, neg COPD, neg recent URI, former smoker   Pulmonary exam normal breath sounds clear to auscultation       Cardiovascular negative cardio ROS  Rhythm:Regular Rate:Normal     Neuro/Psych negative neurological ROS     GI/Hepatic Neg liver ROS,,,Acute cholecystitis, celiac disease   Endo/Other  neg diabetes  Class 4 obesity  Renal/GU negative Renal ROS     Musculoskeletal   Abdominal  (+) + obese  Peds  Hematology negative hematology ROS (+) Lab Results      Component                Value               Date                      WBC                      16.8 (H)            09/11/2023                HGB                      15.1 (H)            09/11/2023                HCT                      45.5                09/11/2023                MCV                      85.5                09/11/2023                PLT                      387                 09/11/2023              Anesthesia Other Findings Last Ozempic: 09/08/2023  Reproductive/Obstetrics PCOS                             Anesthesia Physical Anesthesia Plan  ASA: 3  Anesthesia Plan: General   Post-op Pain Management:    Induction: Intravenous and Rapid sequence  PONV Risk Score and Plan: 4 or greater and Ondansetron, Dexamethasone and Treatment may vary due to age or medical condition  Airway Management Planned: Oral ETT  Additional Equipment:    Intra-op Plan:   Post-operative Plan: Extubation in OR  Informed Consent: I have  reviewed the patients History and Physical, chart, labs and discussed the procedure including the risks, benefits and alternatives for the proposed anesthesia with the patient or authorized representative who has indicated his/her understanding and acceptance.     Dental advisory given  Plan Discussed with: CRNA and Anesthesiologist  Anesthesia Plan Comments: (Risks of general anesthesia discussed including, but not limited to, sore throat, hoarse voice, chipped/damaged teeth, injury to vocal cords, nausea and vomiting, allergic reactions, lung infection, heart attack, stroke, and death. All questions answered. )       Anesthesia Quick Evaluation

## 2023-09-12 ENCOUNTER — Observation Stay (HOSPITAL_BASED_OUTPATIENT_CLINIC_OR_DEPARTMENT_OTHER): Payer: Self-pay | Admitting: Certified Registered Nurse Anesthetist

## 2023-09-12 ENCOUNTER — Encounter (HOSPITAL_COMMUNITY)
Admission: EM | Disposition: A | Discharge status: Home / Self Care | Payer: Self-pay | Source: Home / Self Care | Attending: Emergency Medicine

## 2023-09-12 ENCOUNTER — Encounter (HOSPITAL_COMMUNITY): Payer: Self-pay

## 2023-09-12 ENCOUNTER — Observation Stay (HOSPITAL_COMMUNITY): Payer: Self-pay | Admitting: Certified Registered Nurse Anesthetist

## 2023-09-12 ENCOUNTER — Other Ambulatory Visit: Payer: Self-pay

## 2023-09-12 DIAGNOSIS — K810 Acute cholecystitis: Secondary | ICD-10-CM | POA: Diagnosis not present

## 2023-09-12 DIAGNOSIS — Z6841 Body Mass Index (BMI) 40.0 and over, adult: Secondary | ICD-10-CM

## 2023-09-12 DIAGNOSIS — K821 Hydrops of gallbladder: Secondary | ICD-10-CM

## 2023-09-12 HISTORY — PX: CHOLECYSTECTOMY: SHX55

## 2023-09-12 LAB — COMPREHENSIVE METABOLIC PANEL
ALT: 30 U/L (ref 0–44)
AST: 33 U/L (ref 15–41)
Albumin: 3.4 g/dL — ABNORMAL LOW (ref 3.5–5.0)
Alkaline Phosphatase: 64 U/L (ref 38–126)
Anion gap: 10 (ref 5–15)
BUN: 10 mg/dL (ref 6–20)
CO2: 20 mmol/L — ABNORMAL LOW (ref 22–32)
Calcium: 9 mg/dL (ref 8.9–10.3)
Chloride: 104 mmol/L (ref 98–111)
Creatinine, Ser: 0.84 mg/dL (ref 0.44–1.00)
GFR, Estimated: >60 mL/min (ref >60)
Glucose, Bld: 98 mg/dL (ref 70–99)
Potassium: 3.9 mmol/L (ref 3.5–5.1)
Sodium: 134 mmol/L — ABNORMAL LOW (ref 135–145)
Total Bilirubin: 0.9 mg/dL (ref 0.0–1.2)
Total Protein: 6.9 g/dL (ref 6.5–8.1)

## 2023-09-12 LAB — SURGICAL PCR SCREEN
MRSA, PCR: NEGATIVE
Staphylococcus aureus: NEGATIVE

## 2023-09-12 LAB — CBC
HCT: 42.8 % (ref 36.0–46.0)
Hemoglobin: 14.4 g/dL (ref 12.0–15.0)
MCH: 28.2 pg (ref 26.0–34.0)
MCHC: 33.6 g/dL (ref 30.0–36.0)
MCV: 83.9 fL (ref 80.0–100.0)
Platelets: 283 10*3/uL (ref 150–400)
RBC: 5.1 MIL/uL (ref 3.87–5.11)
RDW: 13.9 % (ref 11.5–15.5)
WBC: 12.6 10*3/uL — ABNORMAL HIGH (ref 4.0–10.5)
nRBC: 0 % (ref 0.0–0.2)

## 2023-09-12 SURGERY — LAPAROSCOPIC CHOLECYSTECTOMY
Anesthesia: General | Site: Abdomen

## 2023-09-12 MED ORDER — CHLORHEXIDINE GLUCONATE 0.12 % MT SOLN
OROMUCOSAL | Status: AC
  Administered 2023-09-12: 15 mL via OROMUCOSAL
  Filled 2023-09-12: qty 15

## 2023-09-12 MED ORDER — FENTANYL CITRATE (PF) 100 MCG/2ML IJ SOLN
INTRAMUSCULAR | Status: AC
  Administered 2023-09-12: 50 ug via INTRAVENOUS
  Filled 2023-09-12: qty 2

## 2023-09-12 MED ORDER — DEXMEDETOMIDINE HCL IN NACL 80 MCG/20ML IV SOLN
INTRAVENOUS | Status: DC | PRN
  Administered 2023-09-12 (×2): 8 ug via INTRAVENOUS

## 2023-09-12 MED ORDER — SUGAMMADEX SODIUM 200 MG/2ML IV SOLN
INTRAVENOUS | Status: DC | PRN
  Administered 2023-09-12: 400 mg via INTRAVENOUS

## 2023-09-12 MED ORDER — SUCCINYLCHOLINE CHLORIDE 200 MG/10ML IV SOSY
PREFILLED_SYRINGE | INTRAVENOUS | Status: AC
  Filled 2023-09-12: qty 10

## 2023-09-12 MED ORDER — DEXAMETHASONE SODIUM PHOSPHATE 10 MG/ML IJ SOLN
INTRAMUSCULAR | Status: AC
  Filled 2023-09-12: qty 1

## 2023-09-12 MED ORDER — ONDANSETRON HCL 4 MG/2ML IJ SOLN
INTRAMUSCULAR | Status: AC
Start: 2023-09-12 — End: ?
  Filled 2023-09-12: qty 2

## 2023-09-12 MED ORDER — CHLORHEXIDINE GLUCONATE 0.12 % MT SOLN: 15.0000 mL | Freq: Once | OROMUCOSAL | Status: AC

## 2023-09-12 MED ORDER — KETOROLAC TROMETHAMINE 30 MG/ML IJ SOLN
INTRAMUSCULAR | Status: DC | PRN
  Administered 2023-09-12: 30 mg via INTRAVENOUS

## 2023-09-12 MED ORDER — SCOPOLAMINE 1 MG/3DAYS TD PT72
MEDICATED_PATCH | TRANSDERMAL | Status: DC | PRN
  Administered 2023-09-12: 1 via TRANSDERMAL

## 2023-09-12 MED ORDER — ONDANSETRON HCL 4 MG/2ML IJ SOLN
INTRAMUSCULAR | Status: AC
  Filled 2023-09-12: qty 2

## 2023-09-12 MED ORDER — FENTANYL CITRATE (PF) 250 MCG/5ML IJ SOLN
INTRAMUSCULAR | Status: AC
  Filled 2023-09-12: qty 5

## 2023-09-12 MED ORDER — AMISULPRIDE (ANTIEMETIC) 5 MG/2ML IV SOLN
10.0000 mg | Freq: Once | INTRAVENOUS | Status: AC | PRN
  Administered 2023-09-12: 10 mg via INTRAVENOUS

## 2023-09-12 MED ORDER — BUPIVACAINE-EPINEPHRINE (PF) 0.25% -1:200000 IJ SOLN
INTRAMUSCULAR | Status: AC
  Filled 2023-09-12: qty 30

## 2023-09-12 MED ORDER — MIDAZOLAM HCL 2 MG/2ML IJ SOLN
INTRAMUSCULAR | Status: AC
  Filled 2023-09-12: qty 2

## 2023-09-12 MED ORDER — ROCURONIUM BROMIDE 10 MG/ML (PF) SYRINGE
PREFILLED_SYRINGE | INTRAVENOUS | Status: AC
  Filled 2023-09-12: qty 10

## 2023-09-12 MED ORDER — DEXTROSE 5 % IV SOLN
INTRAVENOUS | Status: DC | PRN
  Administered 2023-09-12: 3 g via INTRAVENOUS

## 2023-09-12 MED ORDER — SODIUM CHLORIDE 0.9 % IR SOLN
Status: DC | PRN
  Administered 2023-09-12: 1000 mL

## 2023-09-12 MED ORDER — OXYCODONE HCL 5 MG/5ML PO SOLN: 5.0000 mg | Freq: Once | ORAL | Status: AC | PRN

## 2023-09-12 MED ORDER — ROCURONIUM BROMIDE 10 MG/ML (PF) SYRINGE
PREFILLED_SYRINGE | INTRAVENOUS | Status: DC | PRN
  Administered 2023-09-12: 20 mg via INTRAVENOUS
  Administered 2023-09-12: 60 mg via INTRAVENOUS

## 2023-09-12 MED ORDER — LIDOCAINE 2% (20 MG/ML) 5 ML SYRINGE
INTRAMUSCULAR | Status: AC
  Filled 2023-09-12: qty 5

## 2023-09-12 MED ORDER — ORAL CARE MOUTH RINSE: 15.0000 mL | Freq: Once | OROMUCOSAL | Status: AC

## 2023-09-12 MED ORDER — OXYCODONE HCL 5 MG/5ML PO SOLN
ORAL | Status: AC
  Administered 2023-09-12: 5 mg via ORAL
  Filled 2023-09-12: qty 5

## 2023-09-12 MED ORDER — PROPOFOL 10 MG/ML IV BOLUS
INTRAVENOUS | Status: AC
  Filled 2023-09-12: qty 20

## 2023-09-12 MED ORDER — FENTANYL CITRATE (PF) 100 MCG/2ML IJ SOLN
25.0000 ug | INTRAMUSCULAR | Status: DC | PRN
  Administered 2023-09-12 (×2): 50 ug via INTRAVENOUS

## 2023-09-12 MED ORDER — DEXAMETHASONE SODIUM PHOSPHATE 10 MG/ML IJ SOLN
INTRAMUSCULAR | Status: DC | PRN
  Administered 2023-09-12: 10 mg via INTRAVENOUS

## 2023-09-12 MED ORDER — PROPOFOL 10 MG/ML IV BOLUS
INTRAVENOUS | Status: DC | PRN
  Administered 2023-09-12: 250 mg via INTRAVENOUS

## 2023-09-12 MED ORDER — ONDANSETRON HCL 4 MG/2ML IJ SOLN
INTRAMUSCULAR | Status: DC | PRN
  Administered 2023-09-12: 4 mg via INTRAVENOUS

## 2023-09-12 MED ORDER — SUCCINYLCHOLINE CHLORIDE 200 MG/10ML IV SOSY
PREFILLED_SYRINGE | INTRAVENOUS | Status: DC | PRN
  Administered 2023-09-12: 180 mg via INTRAVENOUS

## 2023-09-12 MED ORDER — INDOCYANINE GREEN 25 MG IV SOLR
2.5000 mg | Freq: Once | INTRAVENOUS | Status: AC
  Administered 2023-09-12: 2.5 mg via TOPICAL
  Filled 2023-09-12: qty 10

## 2023-09-12 MED ORDER — SCOPOLAMINE 1 MG/3DAYS TD PT72
MEDICATED_PATCH | TRANSDERMAL | Status: AC
  Filled 2023-09-12: qty 1

## 2023-09-12 MED ORDER — FENTANYL CITRATE (PF) 100 MCG/2ML IJ SOLN
INTRAMUSCULAR | Status: AC
  Filled 2023-09-12: qty 2

## 2023-09-12 MED ORDER — BUPIVACAINE-EPINEPHRINE 0.25% -1:200000 IJ SOLN
INTRAMUSCULAR | Status: DC | PRN
  Administered 2023-09-12: 22 mL

## 2023-09-12 MED ORDER — DEXMEDETOMIDINE HCL IN NACL 80 MCG/20ML IV SOLN
INTRAVENOUS | Status: AC
  Filled 2023-09-12: qty 20

## 2023-09-12 MED ORDER — LACTATED RINGERS IV SOLN: INTRAVENOUS | Status: AC

## 2023-09-12 MED ORDER — CEFAZOLIN SODIUM 1 G IJ SOLR
INTRAMUSCULAR | Status: AC
Start: 2023-09-12 — End: ?
  Filled 2023-09-12: qty 30

## 2023-09-12 MED ORDER — OXYCODONE HCL 5 MG PO TABS: 5.0000 mg | ORAL_TABLET | Freq: Once | ORAL | Status: AC | PRN

## 2023-09-12 MED ORDER — LIDOCAINE 2% (20 MG/ML) 5 ML SYRINGE
INTRAMUSCULAR | Status: DC | PRN
  Administered 2023-09-12: 100 mg via INTRAVENOUS

## 2023-09-12 MED ORDER — KETOROLAC TROMETHAMINE 30 MG/ML IJ SOLN
INTRAMUSCULAR | Status: AC
  Filled 2023-09-12: qty 1

## 2023-09-12 MED ORDER — MIDAZOLAM HCL 5 MG/5ML IJ SOLN
INTRAMUSCULAR | Status: DC | PRN
  Administered 2023-09-12: 2 mg via INTRAVENOUS

## 2023-09-12 MED ORDER — FENTANYL CITRATE (PF) 250 MCG/5ML IJ SOLN
INTRAMUSCULAR | Status: DC | PRN
  Administered 2023-09-12: 100 ug via INTRAVENOUS
  Administered 2023-09-12 (×3): 50 ug via INTRAVENOUS

## 2023-09-12 MED ORDER — AMISULPRIDE (ANTIEMETIC) 5 MG/2ML IV SOLN
INTRAVENOUS | Status: AC
  Filled 2023-09-12: qty 4

## 2023-09-12 MED ORDER — 0.9 % SODIUM CHLORIDE (POUR BTL) OPTIME
TOPICAL | Status: DC | PRN
  Administered 2023-09-12: 1000 mL

## 2023-09-12 MED ORDER — HEMOSTATIC AGENTS (NO CHARGE) OPTIME
TOPICAL | Status: DC | PRN
  Administered 2023-09-12: 1 via TOPICAL

## 2023-09-12 SURGICAL SUPPLY — 37 items
APPLIER CLIP 5 13 M/L LIGAMAX5 (MISCELLANEOUS) ×1
CANISTER SUCT 3000ML PPV (MISCELLANEOUS) ×1 IMPLANT
CHLORAPREP W/TINT 26 (MISCELLANEOUS) ×1 IMPLANT
CLIP APPLIE 5 13 M/L LIGAMAX5 (MISCELLANEOUS) ×1 IMPLANT
COVER SURGICAL LIGHT HANDLE (MISCELLANEOUS) ×1 IMPLANT
DERMABOND ADVANCED .7 DNX12 (GAUZE/BANDAGES/DRESSINGS) ×1 IMPLANT
DISSECTOR BLUNT TIP ENDO 5MM (MISCELLANEOUS) IMPLANT
ELECT REM PT RETURN 9FT ADLT (ELECTROSURGICAL) ×1
ELECTRODE REM PT RTRN 9FT ADLT (ELECTROSURGICAL) ×1 IMPLANT
GLOVE BIO SURGEON STRL SZ7.5 (GLOVE) ×1 IMPLANT
GLOVE INDICATOR 8.0 STRL GRN (GLOVE) ×1 IMPLANT
GOWN STRL REUS W/ TWL LRG LVL3 (GOWN DISPOSABLE) ×2 IMPLANT
GOWN STRL REUS W/ TWL XL LVL3 (GOWN DISPOSABLE) ×1 IMPLANT
GRASPER SUT TROCAR 14GX15 (MISCELLANEOUS) IMPLANT
IRRIG SUCT STRYKERFLOW 2 WTIP (MISCELLANEOUS) ×1
IRRIGATION SUCT STRKRFLW 2 WTP (MISCELLANEOUS) ×1 IMPLANT
KIT BASIN OR (CUSTOM PROCEDURE TRAY) ×1 IMPLANT
KIT IMAGING PINPOINTPAQ (MISCELLANEOUS) IMPLANT
KIT TURNOVER KIT B (KITS) ×1 IMPLANT
NDL INSUFFLATION 14GA 120MM (NEEDLE) IMPLANT
NEEDLE INSUFFLATION 14GA 120MM (NEEDLE) ×1 IMPLANT
NS IRRIG 1000ML POUR BTL (IV SOLUTION) ×1 IMPLANT
PAD ARMBOARD 7.5X6 YLW CONV (MISCELLANEOUS) ×1 IMPLANT
PENCIL BUTTON HOLSTER BLD 10FT (ELECTRODE) ×1 IMPLANT
SCISSORS LAP 5X35 DISP (ENDOMECHANICALS) ×1 IMPLANT
SET TUBE SMOKE EVAC HIGH FLOW (TUBING) ×1 IMPLANT
SUT MNCRL AB 4-0 PS2 18 (SUTURE) ×1 IMPLANT
SYS BAG RETRIEVAL 10MM (BASKET)
SYSTEM BAG RETRIEVAL 10MM (BASKET) IMPLANT
TOWEL GREEN STERILE FF (TOWEL DISPOSABLE) ×1 IMPLANT
TRAY LAPAROSCOPIC MC (CUSTOM PROCEDURE TRAY) ×1 IMPLANT
TROCAR 5M 150ML BLDLS (TROCAR) IMPLANT
TROCAR ADV FIXATION 5X100MM (TROCAR) ×3 IMPLANT
TROCAR Z THREAD OPTICAL 12X150 (TROCAR) IMPLANT
TROCAR Z THREAD OPTICAL 5X150 (TROCAR) IMPLANT
WARMER LAPAROSCOPE (MISCELLANEOUS) ×1 IMPLANT
WATER STERILE IRR 1000ML POUR (IV SOLUTION) ×1 IMPLANT

## 2023-09-12 NOTE — Op Note (Signed)
09/12/2023 9:29 AM  PATIENT: Angelica Alvarado  38 y.o. female  Patient Care Team: Benita Stabile, MD as PCP - General (Internal Medicine) Jena Gauss, Gerrit Friends, MD as Consulting Physician (Gastroenterology)  PRE-OPERATIVE DIAGNOSIS: Acute cholecystitis  POST-OPERATIVE DIAGNOSIS: Acute on chronic cholecystitis with hydrops gallbladder  PROCEDURE: Laparoscopic cholecystectomy with indocyanine green cholangiogram  SURGEON: Marin Olp, MD  ASSISTANT: Berenda Morale, RNFA  ANESTHESIA: General endotracheal  EBL: 20 mL  DRAINS: None  SPECIMEN: Gallbladder  COUNTS: Sponge, needle and instrument counts were reported correct x2 at the conclusion of the operation  DISPOSITION: PACU in satisfactory condition  COMPLICATIONS: None  FINDINGS: Acutely inflamed gallbladder with hydrops; critical view of safety achieved prior to clipping or dividing any structures.   DESCRIPTION:   The patient was identified & brought into the operating room.  She was then positioned supine on the OR table. SCDs were in place and active during the entire case.  She then underwent general endotracheal anesthesia. Pressure points were padded. Hair on the abdomen was clipped by the OR team. The abdomen was prepped and draped in the standard sterile fashion. Antibiotics were administered. A surgical timeout was performed and confirmed our plan.   Given habitus, we opted to proceed with a Veress needle access at Palmer's point.  An OG had been placed by anesthesia was confirmed to be to suction.  Stab incision was created in the skin.  The Veress needle was introduced into the peritoneal cavity on the first attempt.  Intraperitoneal location was confirmed with the aspiration and saline drop test.  Pneumoperitoneum established to 15 mmHg using CO2.  At Palmer's point, 5 mm optical viewing trocar was then carefully inserted into the peritoneal cavity.  Intraperitoneal inspection demonstrates no evidence of Veress  needle or trocar site complication.   The abdomen is surveyed.  There are some changes as expected with fatty liver but no overt signs of cirrhosis.  The gallbladder is identified.  It is inflamed in appearance.  2 additional 5 mm trocars were placed in the right upper quadrant direct visualization.  The left upper quadrant trocar was removed.  A 5 mm trocar was placed well cephalad to the umbilicus given her habitus which was used as our camera port.  A 12 mm trocar was then placed under direct visualization near the mid epigastrium just to the right of the falciform ligament.  The gallbladder is tense and therefore unable to be easily grasped.  It is aspirated using a laparoscopic Nezhat.  The contents are consistent with hydrops without any significant bilious fluid.  There is a large stone in the neck of the gallbladder.  The gallbladder fundus was grasped and elevated cephalad. An additional grasper was then placed on the infundibulum of the gallbladder and the infundibulum was retracted laterally. Staying high on the gallbladder, the peritoneum on both sides of the gallbladder was opened with hook cautery. Gentle blunt dissection was then employed with a Art gallery manager working down into [employer]. The cystic duct was identified and carefully circumferentially dissected. The cystic artery was also identified and carefully circumferentially dissected. The space between the cystic artery and hepatocystic plate was developed such that a good view of the liver could be seen through a window medial to the cystic artery. The triangle of Calot had been cleared of all fibrofatty tissue. At this point, a critical view of safety was achieved and the only structures visualized was the skeletonized cystic duct laterally, the skeletonized cystic artery and  the liver through the window medial to the artery. No posterior cystic artery was noted  At this point, attention is directed at the near infrared  cholangiogram.  Under near-infrared light, cholangiography demonstrates avid uptake of tracer by the liver and excretion into the biliary system.  There is tracer seen in the hepatoduodenal ligament and through the wall of the duodenum.  This was clinically consistent with a patent biliary system.  There is faint tracer seen in our candidate cystic duct.  There is no tracer seen in her candidate cystic artery.  There is also no tracer visible in any tubular structures in the window that we had created medial to our cystic artery.  This is all consistent with a normal ICG cholangiogram.  The cystic duct and artery were clipped with 2 clips on the patient side and 1 clip on the specimen side. The cystic duct and artery were then divided. The gallbladder was then freed from its remaining attachments to the liver using electrocautery and placed into an endocatch bag. The RUQ was gently irrigated with sterile saline. Hemostasis was then verified. The clips were in good position; the gallbladder fossa was dry. Surgicel snow was placed in the gallbladder fossa. The rest of the abdomen was inspected no injury nor bleeding elsewhere was identified.  The endocatch bag containing the gallbladder was then removed from the 12 mm port site after enlarged the fascial defect and passed off as specimen.  The 12 mm trocar site was then closed using a laparoscopic suture passer and a 0 Vicryl suture.  2 figure-of-eight sutures were placed at this location effectively obliterating the fascial defect.  The fascial closure was inspected under pneumoperitoneum and found to both be airtight as well as no palpable defect.  The RUQ ports were removed under direct visualization and noted to be hemostatic.  The camera port is removed.  The skin of all incision sites was approximated with 4-0 monocryl subcuticular suture and dermabond applied. She was then awakened from anesthesia, extubated, and transferred to a stretcher for transport to  PACU in satisfactory condition.

## 2023-09-12 NOTE — Progress Notes (Signed)
   09/12/23 1136  TOC Brief Assessment  Insurance and Status Reviewed  Patient has primary care physician Yes  Home environment has been reviewed spouse  Prior level of function: independent  Prior/Current Home Services No current home services  Social Drivers of Health Review SDOH reviewed no interventions necessary  Readmission risk has been reviewed No  Transition of care needs no transition of care needs at this time      Transition of Care Department [facility]) has reviewed patient and no TOC needs have been identified at this time. We will continue to monitor patient advancement through interdisciplinary progression rounds. If new patient transition needs arise, please place a TOC consult.

## 2023-09-12 NOTE — Transfer of Care (Signed)
Immediate Anesthesia Transfer of Care Note  Patient: Angelica Alvarado  Procedure(s) Performed: LAPAROSCOPIC CHOLECYSTECTOMY WITH ICG DYE (Abdomen)  Patient Location: PACU  Anesthesia Type:General  Level of Consciousness: drowsy and patient cooperative  Airway & Oxygen Therapy: Patient Spontanous Breathing and Patient connected to face mask oxygen  Post-op Assessment: Report given to RN and Post -op Vital signs reviewed and stable  Post vital signs: Reviewed and stable  Last Vitals:  Vitals Value Taken Time  BP 90/49 09/12/23 0930  Temp 36.6 C 09/12/23 0930  Pulse 74 09/12/23 0932  Resp 22 09/12/23 0932  SpO2 94 % 09/12/23 0932  Vitals shown include unfiled device data.  Last Pain:  Vitals:   09/12/23 0930  TempSrc:   PainSc: Asleep         Complications: No notable events documented.

## 2023-09-12 NOTE — Progress Notes (Signed)
Consent signed and surgical PCR sent to lab.

## 2023-09-12 NOTE — Anesthesia Postprocedure Evaluation (Signed)
Anesthesia Post Note  Patient: Angelica Alvarado  Procedure(s) Performed: LAPAROSCOPIC CHOLECYSTECTOMY WITH ICG DYE (Abdomen)     Patient location during evaluation: PACU Anesthesia Type: General Level of consciousness: awake Pain management: pain level controlled Vital Signs Assessment: post-procedure vital signs reviewed and stable Respiratory status: spontaneous breathing, nonlabored ventilation and respiratory function stable Cardiovascular status: blood pressure returned to baseline and stable Postop Assessment: no apparent nausea or vomiting Anesthetic complications: no   No notable events documented.  Last Vitals:  Vitals:   09/12/23 1015 09/12/23 1036  BP: (!) 99/59 (!) 104/56  Pulse: 72 72  Resp: 13 16  Temp: 36.6 C   SpO2: 94% 94%    Last Pain:  Vitals:   09/12/23 1015  TempSrc:   PainSc: 4                  Linton Rump

## 2023-09-12 NOTE — Progress Notes (Signed)
Subjective No acute events. Feeling reasonably well. States she is ready for surgery today  Objective: Vital signs in last 24 hours: Temp:  [98 F (36.7 C)-98.8 F (37.1 C)] 98.3 F (36.8 C) (01/16 0640) Pulse Rate:  [71-102] 79 (01/16 0640) Resp:  [16-20] 18 (01/16 0640) BP: (115-138)/(72-99) 133/78 (01/16 0640) SpO2:  [94 %-100 %] 95 % (01/16 0640) Weight:  [154.7 kg] 154.7 kg (01/16 0640)    Intake/Output from previous day: 01/15 0701 - 01/16 0700 In: 579 [P.O.:480; IV Piggyback:99] Out: -  Intake/Output this shift: No intake/output data recorded.  Gen: NAD, comfortable CV: RRR Pulm: Normal work of breathing Abd: Soft, mild discomfort RUQ/MEG Ext: SCDs in place  Lab Results: CBC  Recent Labs    09/11/23 1514 09/12/23 0607  WBC 16.8* 12.6*  HGB 15.1* 14.4  HCT 45.5 42.8  PLT 387 283   BMET Recent Labs    09/11/23 1514 09/12/23 0607  NA 135 134*  K 3.9 3.9  CL 100 104  CO2 23 20*  GLUCOSE 96 98  BUN 9 10  CREATININE 0.98 0.84  CALCIUM 9.7 9.0   PT/INR No results for input(s): "LABPROT", "INR" in the last 72 hours. ABG No results for input(s): "PHART", "HCO3" in the last 72 hours.  Invalid input(s): "PCO2", "PO2"  Studies/Results:  Anti-infectives: Anti-infectives (From admission, onward)    Start     Dose/Rate Route Frequency Ordered Stop   09/11/23 1445  [MAR Hold]  cefTRIAXone (ROCEPHIN) 2 g in sodium chloride 0.9 % 100 mL IVPB        (MAR Hold since Thu 09/12/2023 at 0638.Hold Reason: Transfer to a Procedural area)   2 g 200 mL/hr over 30 Minutes Intravenous Every 24 hours 09/11/23 1441 09/18/23 1444   09/11/23 1330  piperacillin-tazobactam (ZOSYN) IVPB 3.375 g  Status:  Discontinued        3.375 g 100 mL/hr over 30 Minutes Intravenous  Once 09/11/23 1328 09/11/23 1454        Assessment/Plan: Patient Active Problem List   Diagnosis Date Noted   Acute cholecystitis 09/11/2023   -The anatomy and physiology of the hepatobiliary  system was discussed with her again today. The pathophysiology of gallbladder disease was then re-reviewed as well. -The options for treatment were discussed including ongoing observation which may result in subsequent gallbladder complications (infection, pancreatitis, choledocholithiasis, etc), drainage procedures, and surgery - laparoscopic cholecystectomy with ICG cholangiography -The planned procedure, material risks (including, but not limited to, pain, bleeding, infection, scarring, need for blood transfusion, damage to surrounding structures- blood vessels/nerves/viscus/organs, damage to bile duct, bile leak, chronic diarrhea, conversion to a 'subtotal' cholecystectomy and general expectations therein, post-cholecystectomy diarrhea, potential need for additional procedures including EGD/ERCP, hernia, worsening of pre-existing medical conditions, pancreatitis, pneumonia, heart attack, stroke, death) benefits and alternatives to surgery were discussed at length. We have noted a good probability that the procedure would help improve her symptoms. The patient's questions were answered to her satisfaction, she voiced understanding and elected to proceed with surgery. Additionally, we discussed typical postoperative expectations and the recovery process.   LOS: 0 days   I spent a total of 35 minutes in both face-to-face and non-face-to-face activities, excluding procedures performed, for this visit on the date of this encounter.  Check [email or web address] for General Surgery coverage night/weekend/holidays  No secure chat available for me given surgeries/clinic/off post call which would lead to a delay in care.    Marin Olp, MD [facility], A [facility]

## 2023-09-12 NOTE — Plan of Care (Signed)

## 2023-09-12 NOTE — Anesthesia Procedure Notes (Signed)
Procedure Name: Intubation Date/Time: 09/12/2023 7:34 AM  Performed by: Waynard Edwards, CRNAPre-anesthesia Checklist: Patient identified, Emergency Drugs available, Suction available and Patient being monitored Patient Re-evaluated:Patient Re-evaluated prior to induction Oxygen Delivery Method: Circle system utilized Preoxygenation: Pre-oxygenation with 100% oxygen Induction Type: IV induction and Rapid sequence Laryngoscope Size: Miller and 2 Grade View: Grade I Tube type: Oral Tube size: 7.0 mm Number of attempts: 1 Airway Equipment and Method: Stylet Placement Confirmation: ETT inserted through vocal cords under direct vision, positive ETCO2 and breath sounds checked- equal and bilateral Secured at: 23 cm Tube secured with: Tape Dental Injury: Teeth and Oropharynx as per pre-operative assessment

## 2023-09-12 NOTE — Discharge Instructions (Signed)
[facility] ______[facility] °LAPAROSCOPIC SURGERY: POST OP INSTRUCTIONS °Always review your discharge instruction sheet given to you by the facility where your surgery was performed. °IF YOU HAVE DISABILITY OR FAMILY LEAVE FORMS, YOU MUST BRING THEM TO THE OFFICE FOR PROCESSING.   °DO NOT GIVE THEM TO YOUR DOCTOR. ° °A prescription for pain medication may be given to you upon discharge.  Take your pain medication as prescribed, if needed.  If narcotic pain medicine is not needed, then you may take acetaminophen (Tylenol) or ibuprofen (Advil) as needed. °Take your usually prescribed medications unless otherwise directed. °If you need a refill on your pain medication, please contact your pharmacy.  They will contact our office to request authorization. Prescriptions will not be filled after 5pm or on week-ends. °You should follow a light diet the first few days after arrival home, such as soup and crackers, etc.  Be sure to include lots of fluids daily. °Most patients will experience some swelling and bruising in the area of the incisions.  Ice packs will help.  Swelling and bruising can take several days to resolve.  °It is common to experience some constipation if taking pain medication after surgery.  Increasing fluid intake and taking a stool softener (such as Colace) will usually help or prevent this problem from occurring.  A mild laxative (Milk of Magnesia or Miralax) should be taken according to package instructions if there are no bowel movements after 48 hours. °Unless discharge instructions indicate otherwise, you may remove your bandages 24-48 hours after surgery, and you may shower at that time.  You may have steri-strips (small skin tapes) in place directly over the incision.  These strips should be left on the skin for 7-10 days.  If your surgeon used skin glue on the incision, you may shower in 24 hours.  The glue will flake off over the next 2-3 weeks.  Any sutures or staples will be  removed at the office during your follow-up visit. °ACTIVITIES:  You may resume regular (light) daily activities beginning the next day--such as daily self-care, walking, climbing stairs--gradually increasing activities as tolerated.  You may have sexual intercourse when it is comfortable.  Refrain from any heavy lifting or straining until approved by your doctor. °You may drive when you are no longer taking prescription pain medication, you can comfortably wear a seatbelt, and you can safely maneuver your car and apply brakes. °RETURN TO WORK:  __________________________________________________________ °You should see your doctor in the office for a follow-up appointment approximately 2-3 weeks after your surgery.  Make sure that you call for this appointment within a day or two after you arrive home to insure a convenient appointment time. °OTHER INSTRUCTIONS: __________________________________________________________________________________________________________________________ __________________________________________________________________________________________________________________________ °WHEN TO CALL YOUR DOCTOR: °Fever over 101.0 °Inability to urinate °Continued bleeding from incision. °Increased pain, redness, or drainage from the incision. °Increasing abdominal pain ° °The clinic staff is available to answer your questions during regular business hours.  Please don’t hesitate to call and ask to speak to one of the nurses for clinical concerns.  If you have a medical emergency, go to the nearest emergency room or call 911.  A surgeon from [facility] is always on call at the hospital. °[address], [address], [place], [place]  [address] ? [address] [address], , [place]   [address] °[phone] ? [phone] ? FAX [phone] °Web site: [email or web address] ° ° ° °Managing Your Pain After Surgery Without Opioids ° ° ° °Thank you for   participating in our program to  help patients manage their pain after surgery without opioids. This is part of our effort to provide you with the best care possible, without exposing you or your family to the risk that opioids pose. ° °What pain can I expect after surgery? °You can expect to have some pain after surgery. This is normal. The pain is typically worse the day after surgery, and quickly begins to get better. °Many studies have found that many patients are able to manage their pain after surgery with Over-the-Counter (OTC) medications such as Tylenol and Motrin. If you have a condition that does not allow you to take Tylenol or Motrin, notify your surgical team. ° °How will I manage my pain? °The best strategy for controlling your pain after surgery is around the clock pain control with Tylenol (acetaminophen) and Motrin (ibuprofen or Advil). Alternating these medications with each other allows you to maximize your pain control. In addition to Tylenol and Motrin, you can use heating pads or ice packs on your incisions to help reduce your pain. ° °How will I alternate your regular strength over-the-counter pain medication? °You will take a dose of pain medication every three hours. °Start by taking 650 mg of Tylenol (2 pills of 325 mg) °3 hours later take 600 mg of Motrin (3 pills of 200 mg) °3 hours after taking the Motrin take 650 mg of Tylenol °3 hours after that take 600 mg of Motrin. ° ° °- 1 - ° °See example - if your first dose of Tylenol is at 12:00 PM ° ° °12:00 PM Tylenol 650 mg (2 pills of 325 mg)  °3:00 PM Motrin 600 mg (3 pills of 200 mg)  °6:00 PM Tylenol 650 mg (2 pills of 325 mg)  °9:00 PM Motrin 600 mg (3 pills of 200 mg)  °Continue alternating every 3 hours  ° °We recommend that you follow this schedule around-the-clock for at least 3 days after surgery, or until you feel that it is no longer needed. Use the table on the last page of this handout to keep track of the medications you are taking. °Important: °Do not take  more than 3000mg of Tylenol or 3200mg of Motrin in a 24-hour period. °Do not take ibuprofen/Motrin if you have a history of bleeding stomach ulcers, severe kidney disease, &/or actively taking a blood thinner ° °What if I still have pain? °If you have pain that is not controlled with the over-the-counter pain medications (Tylenol and Motrin or Advil) you might have what we call “breakthrough” pain. You will receive a prescription for a small amount of an opioid pain medication such as Oxycodone, Tramadol, or Tylenol with Codeine. Use these opioid pills in the first 24 hours after surgery if you have breakthrough pain. Do not take more than 1 pill every 4-6 hours. ° °If you still have uncontrolled pain after using all opioid pills, don't hesitate to call our staff using the number provided. We will help make sure you are managing your pain in the best way possible, and if necessary, we can provide a prescription for additional pain medication. ° ° °Day 1   ° °Time  °Name of Medication Number of pills taken  °Amount of Acetaminophen  °Pain Level  ° °Comments  °AM PM       °AM PM       °AM PM       °AM PM       °AM PM       °  AM PM       °AM PM       °AM PM       °Total Daily amount of Acetaminophen °Do not take more than  3,000 mg per day    ° ° °Day 2   ° °Time  °Name of Medication Number of pills °taken  °Amount of Acetaminophen  °Pain Level  ° °Comments  °AM PM       °AM PM       °AM PM       °AM PM       °AM PM       °AM PM       °AM PM       °AM PM       °Total Daily amount of Acetaminophen °Do not take more than  3,000 mg per day    ° ° °Day 3   ° °Time  °Name of Medication Number of pills taken  °Amount of Acetaminophen  °Pain Level  ° °Comments  °AM PM       °AM PM       °AM PM       °AM PM       ° ° ° °AM PM       °AM PM       °AM PM       °AM PM       °Total Daily amount of Acetaminophen °Do not take more than  3,000 mg per day    ° ° °Day 4   ° °Time  °Name of Medication Number of pills taken  °Amount of  Acetaminophen  °Pain Level  ° °Comments  °AM PM       °AM PM       °AM PM       °AM PM       °AM PM       °AM PM       °AM PM       °AM PM       °Total Daily amount of Acetaminophen °Do not take more than  3,000 mg per day    ° ° °Day 5   ° °Time  °Name of Medication Number °of pills taken  °Amount of Acetaminophen  °Pain Level  ° °Comments  °AM PM       °AM PM       °AM PM       °AM PM       °AM PM       °AM PM       °AM PM       °AM PM       °Total Daily amount of Acetaminophen °Do not take more than  3,000 mg per day    ° ° ° °Day 6   ° °Time  °Name of Medication Number of pills °taken  °Amount of Acetaminophen  °Pain Level  °Comments  °AM PM       °AM PM       °AM PM       °AM PM       °AM PM       °AM PM       °AM PM       °AM PM       °Total Daily amount of Acetaminophen °Do not take more than  3,000 mg per day    ° ° °Day 7   ° °Time  °  Name of Medication Number of pills taken  °Amount of Acetaminophen  °Pain Level  ° °Comments  °AM PM       °AM PM       °AM PM       °AM PM       °AM PM       °AM PM       °AM PM       °AM PM       °Total Daily amount of Acetaminophen °Do not take more than  3,000 mg per day    ° ° ° ° °For additional information about how and where to safely dispose of unused opioid °medications - https://www.morepowerfulnc.org ° °Disclaimer: This document contains information and/or instructional materials adapted from [facility] for the typical patient with your condition. It does not replace medical advice from your health care provider because your experience may differ from that of the °typical patient. Talk to your health care provider if you have any questions about this °document, your condition or your treatment plan. °Adapted from [facility] ° °

## 2023-09-13 ENCOUNTER — Encounter (HOSPITAL_COMMUNITY): Payer: Self-pay | Admitting: Surgery

## 2023-09-13 ENCOUNTER — Other Ambulatory Visit (HOSPITAL_COMMUNITY): Payer: Self-pay

## 2023-09-13 LAB — SURGICAL PATHOLOGY

## 2023-09-13 MED ORDER — METHOCARBAMOL 500 MG PO TABS
500.0000 mg | ORAL_TABLET | Freq: Three times a day (TID) | ORAL | 0 refills | Status: AC | PRN
  Filled 2023-09-13: qty 12, 4d supply, fill #0

## 2023-09-13 MED ORDER — ONDANSETRON 4 MG PO TBDP
4.0000 mg | ORAL_TABLET | Freq: Four times a day (QID) | ORAL | 0 refills | Status: AC | PRN
  Filled 2023-09-13: qty 8, 2d supply, fill #0

## 2023-09-13 MED ORDER — OXYCODONE HCL 5 MG PO TABS
5.0000 mg | ORAL_TABLET | Freq: Four times a day (QID) | ORAL | 0 refills | Status: AC | PRN
  Filled 2023-09-13: qty 20, 5d supply, fill #0

## 2023-09-13 NOTE — Discharge Summary (Signed)
[facility] Discharge Summary   Patient ID: Angelica Alvarado MRN: [id number] DOB/AGE: 38/12/1985 38 y.o.  Admit date: 09/10/2023 Discharge date: 09/13/2023  Admitting Diagnosis: Acute cholecystitis [K81.0]   Discharge Diagnosis Acute cholecystitis [K81.0]   Consultants None   Imaging: No results found.  Procedures Dr. Cliffton Asters (09/12/23) - Laparoscopic Cholecystectomy with indocyanine green cholangiogram   Hospital Course:  38 y.o. female who presented to the ED with abdominal pain.  Workup showed cholecystitis.  Patient was admitted and underwent procedure listed above.  Tolerated procedure well and was transferred to the floor.  Diet was advanced as tolerated.  On POD1, the patient was voiding well, tolerating diet, ambulating well, pain well controlled, vital signs stable, incisions c/d/i and felt stable for discharge home.  Patient will follow up in our office in 3 weeks and knows to call with questions or concerns.   Physical Exam: General:  Alert, NAD, pleasant, comfortable Abd:  Soft, ND, mild tenderness, incisions C/D/I  I or a member of my team have reviewed this patient in the Controlled Substance Database.  Allergies as of 09/13/2023       Reactions   Gluten Meal Other (See Comments)        Medication List     TAKE these medications    acetaminophen 500 MG tablet Commonly known as: TYLENOL Take 500 mg by mouth every 6 (six) hours as needed (pain).   amphetamine-dextroamphetamine 10 MG tablet Commonly known as: ADDERALL Take 10 mg by mouth daily as needed (attention).   clobetasol cream 0.05 % Commonly known as: TEMOVATE Apply 1 Application topically daily as needed (hand rash).   fluticasone 50 MCG/ACT nasal spray Commonly known as: FLONASE Place 2 sprays into both nostrils daily. What changed:  when to take this reasons to take this   methocarbamol 500 MG tablet Commonly known as: ROBAXIN Take 1 tablet (500 mg total) by  mouth every 8 (eight) hours as needed for up to 4 days for muscle spasms.   multivitamin with minerals Tabs tablet Take 1 tablet by mouth every evening.   norethindrone-ethinyl estradiol-FE 1-20 MG-MCG tablet Commonly known as: LOESTRIN FE Take 1 tablet by mouth daily.   nystatin-triamcinolone cream Commonly known as: MYCOLOG II Apply 1 Application topically daily as needed (for rash).   ondansetron 4 MG disintegrating tablet Commonly known as: ZOFRAN-ODT Take 1 tablet (4 mg total) by mouth every 6 (six) hours as needed for up to 2 days for nausea. What changed:  when to take this reasons to take this   oxyCODONE 5 MG immediate release tablet Commonly known as: Oxy IR/ROXICODONE Take 1 tablet (5 mg total) by mouth every 6 (six) hours as needed for up to 5 days for moderate pain (pain score 4-6) or severe pain (pain score 7-10).   Ozempic (1 MG/DOSE) 4 MG/3ML Sopn Generic drug: Semaglutide (1 MG/DOSE) Inject 2 mg into the skin once a week.   polyethylene glycol 17 g packet Commonly known as: MIRALAX / GLYCOLAX Take 17 g by mouth daily as needed for mild constipation.   sucralfate 1 g tablet Commonly known as: Carafate Take 1 tablet (1 g total) by mouth 3 (three) times daily as needed. May dissolve 1 tablet into a glass of water and drink up to 3 times daily as needed prior to meals What changed:  when to take this reasons to take this additional instructions   Vitamin D-3 125 MCG (5000 UT) Tabs Take 5,000 Units by mouth every  evening.          Follow-up Information     [facility], Hedda Slade, [place]. Go on 10/03/2023.   Specialty: General Surgery Why: 2/6 at 3:45 pm., Please arrive 30 minutes early to complete check in, and bring photo ID and insurance card. Contact information: [address] [place] [address] [facility] [place] [address] [phone]                 Signed: Eric Form , [facility] 09/13/2023, 9:51  AM Please see Amion for pager number during day hours 7:00am-4:30pm

## 2023-09-16 LAB — CULTURE, BLOOD (ROUTINE X 2)
Culture: NO GROWTH
Culture: NO GROWTH
Special Requests: ADEQUATE

## 2023-10-14 ENCOUNTER — Other Ambulatory Visit: Payer: Self-pay | Admitting: Family Medicine

## 2023-10-14 MED ORDER — OSELTAMIVIR PHOSPHATE 75 MG PO CAPS: 75.0000 mg | ORAL_CAPSULE | Freq: Two times a day (BID) | ORAL | 0 refills | Status: AC

## 2024-01-14 ENCOUNTER — Other Ambulatory Visit: Payer: Self-pay | Admitting: Family Medicine

## 2024-01-14 ENCOUNTER — Ambulatory Visit: Admitting: Family Medicine

## 2024-01-14 ENCOUNTER — Encounter: Payer: Self-pay | Admitting: Family Medicine

## 2024-01-14 DIAGNOSIS — Z8349 Family history of other endocrine, nutritional and metabolic diseases: Secondary | ICD-10-CM | POA: Diagnosis not present

## 2024-01-14 DIAGNOSIS — E559 Vitamin D deficiency, unspecified: Secondary | ICD-10-CM

## 2024-01-14 DIAGNOSIS — R5383 Other fatigue: Secondary | ICD-10-CM | POA: Diagnosis not present

## 2024-01-14 DIAGNOSIS — E282 Polycystic ovarian syndrome: Secondary | ICD-10-CM

## 2024-01-14 MED ORDER — ZEPBOUND 2.5 MG/0.5ML ~~LOC~~ SOAJ: 2.5000 mg | SUBCUTANEOUS | 1 refills | Status: DC

## 2024-01-14 NOTE — Patient Instructions (Signed)
 Thanks for trusting us  with your healthcare.   I will be in touch with your lab results.

## 2024-01-14 NOTE — Progress Notes (Signed)
 New Patient Office Visit  Subjective    Patient ID: Angelica Alvarado, female    DOB: [birth year]  Age: 38 y.o. MRN: [id number]  CC:  Chief Complaint  Patient presents with   Establish Care    Wants to get back on glp-1, was on ozempic until she had gallbladder out. Wants zepbound instead    HPI Angelica Alvarado presents to establish care Previous PCP: in [place]   Other providers: OB/GYN- Dr. Serge Dancer   She is requesting to restart a GLP-1. She was on Ozempic for over a year. States weight dropped into the 330 lb range. Off of it since January due to having GI symptoms which turned out to be her gallbladder. She had a cholecystectomy in January 2025   States she has had diarrhea intermittently since surgery.   She has done several diets in the past including Weight Watchers and others.   Went to Bariatric clinic in the past  She started the process for weight loss surgery in the past.   Currently she and her husband are working on a diet low in carbohydrates and having more protein. Staying hydrated. Only drinks water.   She has increased her walking.  Job is sedentary. 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  Using condoms. Denies desire for pregnancy     Outpatient Encounter Medications as of 01/14/2024  Medication Sig   acetaminophen  (TYLENOL ) 500 MG tablet Take 500 mg by mouth every 6 (six) hours as needed (pain).   Cholecalciferol (VITAMIN D-3) 125 MCG (5000 UT) TABS Take 5,000 Units by mouth every evening.   clobetasol cream (TEMOVATE) 0.05 % Apply 1 Application topically daily as needed (hand rash).   Multiple Vitamin (MULTIVITAMIN WITH MINERALS) TABS tablet Take 1 tablet by mouth every evening.   nystatin-triamcinolone (MYCOLOG II) cream Apply 1 Application topically daily as needed (for rash).   polyethylene glycol  (MIRALAX  / GLYCOLAX ) 17 g packet Take 17 g by mouth daily as needed for mild constipation.   tirzepatide (ZEPBOUND) 2.5 MG/0.5ML Pen Inject 2.5 mg into the skin once a week.   [DISCONTINUED] amphetamine-dextroamphetamine (ADDERALL) 10 MG tablet Take 10 mg by mouth daily as needed (attention).   [DISCONTINUED] fluticasone  (FLONASE ) 50 MCG/ACT nasal spray Place 2 sprays into both nostrils daily. (Patient taking differently: Place 2 sprays into both nostrils daily as needed for allergies.)   [DISCONTINUED] norethindrone-ethinyl estradiol-FE (LOESTRIN FE) 1-20 MG-MCG tablet Take 1 tablet by mouth daily.   [DISCONTINUED] OZEMPIC, 1 MG/DOSE, 4 MG/3ML SOPN Inject 2 mg into the skin once a week.   [DISCONTINUED] sucralfate  (CARAFATE ) 1 g tablet Take 1 tablet (1 g total) by mouth 3 (three) times daily as needed. May dissolve 1 tablet into a glass of water and drink up to 3 times daily as needed prior to meals (Patient taking differently: Take 1 g by mouth 3 (three) times daily between meals as needed (for gas pain per patient).)   No facility-administered encounter medications on file as of 01/14/2024.    Past Medical History:  Diagnosis Date   Celiac disease    Polycystic ovarian disease    PONV (postoperative nausea and vomiting)     Past Surgical History:  Procedure Laterality Date   BIOPSY  10/12/2020   Procedure: BIOPSY;  Surgeon: Evangeline Hilts, MD;  Location: [facility];  Service: Endoscopy;;  EGD and COLON   CHOLECYSTECTOMY N/A 09/12/2023   Procedure: LAPAROSCOPIC CHOLECYSTECTOMY WITH ICG DYE;  Surgeon: Melvenia Stabs, MD;  Location: [facility];  Service: General;  Laterality: N/A;   COLONOSCOPY WITH PROPOFOL  N/A 10/12/2020   Procedure: COLONOSCOPY WITH PROPOFOL ;  Surgeon: Evangeline Hilts, MD;  Location: [facility];  Service: Endoscopy;  Laterality: N/A;   ESOPHAGOGASTRODUODENOSCOPY (EGD) WITH PROPOFOL  N/A 10/12/2020   Procedure: ESOPHAGOGASTRODUODENOSCOPY (EGD) WITH PROPOFOL ;  Surgeon:  Evangeline Hilts, MD;  Location: [facility];  Service: Endoscopy;  Laterality: N/A;   LAPAROSCOPY Right 09/09/2015   Procedure: DIAGNOSTIC LAPAROSCOPY RIGHT CYST ASPIRATION AND CHROMOPERTUBATION;  Surgeon: Thurman Flores, MD;  Location: [facility];  Service: Gynecology;  Laterality: Right;   LEEP     TONSILLECTOMY     tubes in ears       Family History  Problem Relation Age of Onset   Breast cancer Paternal Grandmother    Cancer Paternal Grandmother    Hyperlipidemia Paternal Grandmother    Hypertension Mother    Obesity Mother    Diabetes Father    Hypertension Father    ADD / ADHD Sister     Social History   Socioeconomic History   Marital status: Married    Spouse name: Not on file   Number of children: Not on file   Years of education: Not on file   Highest education level: Some college, no degree  Occupational History   Not on file  Tobacco Use   Smoking status: Former    Current packs/day: 0.00    Types: Cigarettes    Quit date: 02/21/2018    Years since quitting: 5.8   Smokeless tobacco: Never  Substance and Sexual Activity   Alcohol use: Not Currently    Comment: occ   Drug use: No   Sexual activity: Yes    Birth control/protection: None  Other Topics Concern   Not on file  Social History Narrative   Not on file   Social Drivers of Health   Financial Resource Strain: Low Risk  (01/08/2024)   Overall Financial Resource Strain (CARDIA)    Difficulty of Paying Living Expenses: Not very hard  Food Insecurity: No Food Insecurity (01/08/2024)   Hunger Vital Sign    Worried About Running Out of Food in the Last Year: Never true    Ran Out of Food in the Last Year: Never true  Transportation Needs: No Transportation Needs (01/08/2024)   PRAPARE - Administrator, Civil Service (Medical): No    Lack of Transportation (Non-Medical): No  Physical Activity: Insufficiently Active (01/08/2024)   Exercise Vital Sign    Days of Exercise per Week: 1 day     Minutes of Exercise per Session: 20 min  Stress: No Stress Concern Present (01/08/2024)   [employer] of Occupational Health - Occupational Stress Questionnaire    Feeling of Stress : Only a little  Social Connections: Socially Isolated (01/08/2024)   Social Connection and Isolation Panel [NHANES]    Frequency of Communication with Friends and Family: Once a week    Frequency of Social Gatherings with Friends and Family: Once a week    Attends Religious Services: Never    Database administrator or Organizations: No    Attends Banker Meetings: Not on file    Marital Status: Married  Intimate Partner Violence: Not At Risk (09/12/2023)   Humiliation, Afraid, Rape, and Kick questionnaire  Fear of Current or Ex-Partner: No    Emotionally Abused: No    Physically Abused: No    Sexually Abused: No    Review of Systems  Constitutional:  Positive for malaise/fatigue. Negative for chills and fever.       Daytime -afternoon fatigue   HENT:  Negative for congestion.   Eyes:  Negative for blurred vision and double vision.  Respiratory:  Negative for cough, shortness of breath and wheezing.   Cardiovascular:  Negative for chest pain and palpitations.  Gastrointestinal:  Positive for diarrhea. Negative for abdominal pain, constipation, nausea and vomiting.  Genitourinary:  Negative for dysuria, frequency and urgency.  Musculoskeletal:  Negative for back pain, joint pain and myalgias.  Neurological:  Negative for dizziness, tingling, focal weakness and headaches.  Psychiatric/Behavioral:  Negative for depression and substance abuse. The patient is not nervous/anxious.         Objective    BP 116/84 (BP Location: Left Arm, Patient Position: Sitting)   Pulse 70   Temp 97.8 F (36.6 C) (Temporal)   Ht 5\' 3"  (1.6 m)   Wt (!) 375 lb (170.1 kg)   SpO2 98%   BMI 66.43 kg/m   Physical Exam Constitutional:      General: She is not in acute distress.    Appearance: She is  obese. She is not ill-appearing.  HENT:     Mouth/Throat:     Mouth: Mucous membranes are moist.  Eyes:     Extraocular Movements: Extraocular movements intact.     Conjunctiva/sclera: Conjunctivae normal.  Cardiovascular:     Rate and Rhythm: Normal rate and regular rhythm.  Pulmonary:     Effort: Pulmonary effort is normal.     Breath sounds: Normal breath sounds.  Musculoskeletal:     Cervical back: Normal range of motion and neck supple.  Skin:    General: Skin is warm and dry.  Neurological:     General: No focal deficit present.     Mental Status: She is alert and oriented to person, place, and time.     Motor: No weakness.     Coordination: Coordination normal.  Psychiatric:        Mood and Affect: Mood normal.        Behavior: Behavior normal.        Thought Content: Thought content normal.         Assessment & Plan:   Problem List Items Addressed This Visit     PCOS (polycystic ovarian syndrome)   Relevant Medications   tirzepatide (ZEPBOUND) 2.5 MG/0.5ML Pen   Other Relevant Orders   CBC with Differential/Platelet   Comprehensive metabolic panel with GFR   Hemoglobin A1c   Severe obesity (BMI >= 40) (HCC) - Primary   Relevant Medications   tirzepatide (ZEPBOUND) 2.5 MG/0.5ML Pen   Other Relevant Orders   CBC with Differential/Platelet   Comprehensive metabolic panel with GFR   Hemoglobin A1c   Lipid panel   TSH   T4, free   Other Visit Diagnoses       Family history of hypothyroidism       Relevant Orders   TSH   T4, free   Thyroid peroxidase antibody     Fatigue, unspecified type       Relevant Orders   CBC with Differential/Platelet   Comprehensive metabolic panel with GFR   TSH   T4, free   Vitamin B12   Ferritin   Folate  Vitamin D deficiency       Relevant Orders   VITAMIN D 25 Hydroxy (Vit-D Deficiency, Fractures)      She is a pleasant 38 year old female who is new to the practice and here to establish care. Her main  concern is weight loss.  She has lost weight in the past on Ozempic.  She regained the weight after stopping the medication in January due to having a cholecystectomy.  She has done several diets in the past including weight watchers more than once.  She has been to weight loss clinics in the past.  He is currently eating a low-fat, low-carb diet and getting plenty of protein.  She drinks water.  She has increased her physical activity. No obvious contraindication to restart GLP-1. Zepbound prescribed. Counseling on how to take the medication, potential side effects, and how to be successful on the medication.  She will stay hydrated and work to avoid constipation.  Continue vitamin D deficiency.  Check labs to look for complications r/t obesity and underlying etiology for fatigue.   Return in about 8 weeks (around 03/10/2024) for chronic health conditions.   Alyson Back, NP-C

## 2024-01-15 ENCOUNTER — Ambulatory Visit: Payer: Self-pay | Admitting: Family Medicine

## 2024-01-15 LAB — COMPREHENSIVE METABOLIC PANEL WITH GFR
ALT: 21 IU/L (ref 0–32)
AST: 24 IU/L (ref 0–40)
Albumin: 4.1 g/dL (ref 3.9–4.9)
Alkaline Phosphatase: 83 IU/L (ref 44–121)
BUN/Creatinine Ratio: 17 (ref 9–23)
BUN: 10 mg/dL (ref 6–20)
Bilirubin Total: 0.3 mg/dL (ref 0.0–1.2)
CO2: 23 mmol/L (ref 20–29)
Calcium: 9.4 mg/dL (ref 8.7–10.2)
Chloride: 104 mmol/L (ref 96–106)
Creatinine, Ser: 0.59 mg/dL (ref 0.57–1.00)
Globulin, Total: 2.6 g/dL (ref 1.5–4.5)
Glucose: 90 mg/dL (ref 70–99)
Potassium: 4.8 mmol/L (ref 3.5–5.2)
Sodium: 142 mmol/L (ref 134–144)
Total Protein: 6.7 g/dL (ref 6.0–8.5)
eGFR: 118 mL/min/{1.73_m2} (ref >59)

## 2024-01-15 LAB — CBC WITH DIFFERENTIAL/PLATELET
Basophils Absolute: 0.1 10*3/uL (ref 0.0–0.2)
Basos: 1 %
EOS (ABSOLUTE): 0.8 10*3/uL — ABNORMAL HIGH (ref 0.0–0.4)
Eos: 9 %
Hematocrit: 44 % (ref 34.0–46.6)
Hemoglobin: 14.3 g/dL (ref 11.1–15.9)
Immature Grans (Abs): 0 10*3/uL (ref 0.0–0.1)
Immature Granulocytes: 0 %
Lymphocytes Absolute: 2.4 10*3/uL (ref 0.7–3.1)
Lymphs: 28 %
MCH: 28.9 pg (ref 26.6–33.0)
MCHC: 32.5 g/dL (ref 31.5–35.7)
MCV: 89 fL (ref 79–97)
Monocytes Absolute: 0.5 10*3/uL (ref 0.1–0.9)
Monocytes: 6 %
Neutrophils Absolute: 4.8 10*3/uL (ref 1.4–7.0)
Neutrophils: 56 %
Platelets: 309 10*3/uL (ref 150–450)
RBC: 4.95 x10E6/uL (ref 3.77–5.28)
RDW: 14.2 % (ref 11.7–15.4)
WBC: 8.5 10*3/uL (ref 3.4–10.8)

## 2024-01-15 LAB — FERRITIN: Ferritin: 55 ng/mL (ref 15–150)

## 2024-01-15 LAB — THYROID PEROXIDASE ANTIBODY: Thyroperoxidase Ab SerPl-aCnc: 10 [IU]/mL (ref 0–34)

## 2024-01-15 LAB — LIPID PANEL
Chol/HDL Ratio: 3.2 ratio (ref 0.0–4.4)
Cholesterol, Total: 207 mg/dL — ABNORMAL HIGH (ref 100–199)
HDL: 64 mg/dL (ref >39)
LDL Chol Calc (NIH): 129 mg/dL — ABNORMAL HIGH (ref 0–99)
Triglycerides: 76 mg/dL (ref 0–149)
VLDL Cholesterol Cal: 14 mg/dL (ref 5–40)

## 2024-01-15 LAB — TSH: TSH: 1.5 u[IU]/mL (ref 0.450–4.500)

## 2024-01-15 LAB — FOLATE: Folate: 10.9 ng/mL (ref >3.0)

## 2024-01-15 LAB — VITAMIN B12: Vitamin B-12: 454 pg/mL (ref 232–1245)

## 2024-01-15 LAB — VITAMIN D 25 HYDROXY (VIT D DEFICIENCY, FRACTURES): Vit D, 25-Hydroxy: 42.3 ng/mL (ref 30.0–100.0)

## 2024-01-15 LAB — T4, FREE: Free T4: 0.91 ng/dL (ref 0.82–1.77)

## 2024-01-15 LAB — HEMOGLOBIN A1C
Est. average glucose Bld gHb Est-mCnc: 111 mg/dL
Hgb A1c MFr Bld: 5.5 % (ref 4.8–5.6)

## 2024-02-05 ENCOUNTER — Telehealth: Payer: Self-pay

## 2024-02-05 ENCOUNTER — Other Ambulatory Visit (HOSPITAL_COMMUNITY): Payer: Self-pay

## 2024-02-05 NOTE — Telephone Encounter (Signed)
 Pharmacy Patient Advocate Encounter   Received notification from Patient Pharmacy that prior authorization for Zepbound  2.5 is required/requested.   Insurance verification completed.   The patient is insured through [facility] .   Per test claim: PA required and submitted KEY/EOC/Request #: [id number] from 02/05/24 to 08/06/24. Ran test claim, Copay is $24.99. This test claim was processed through [facility]- copay amounts may vary at other pharmacies due to pharmacy/plan contracts, or as the patient moves through the different stages of their insurance plan.

## 2024-03-04 ENCOUNTER — Encounter: Payer: Self-pay | Admitting: Family Medicine

## 2024-03-04 ENCOUNTER — Other Ambulatory Visit: Payer: Self-pay | Admitting: Family Medicine

## 2024-03-04 MED ORDER — ZEPBOUND 5 MG/0.5ML ~~LOC~~ SOAJ: 5.0000 mg | SUBCUTANEOUS | 3 refills | Status: DC

## 2024-03-04 MED ORDER — ZEPBOUND 5 MG/0.5ML ~~LOC~~ SOAJ
5.0000 mg | SUBCUTANEOUS | 3 refills | Status: DC
  Filled 2024-03-04: qty 2, 28d supply, fill #0

## 2024-03-05 ENCOUNTER — Other Ambulatory Visit (HOSPITAL_COMMUNITY): Payer: Self-pay

## 2024-03-10 ENCOUNTER — Encounter: Payer: Self-pay | Admitting: Family Medicine

## 2024-03-10 ENCOUNTER — Ambulatory Visit: Admitting: Family Medicine

## 2024-03-10 DIAGNOSIS — Z6841 Body Mass Index (BMI) 40.0 and over, adult: Secondary | ICD-10-CM

## 2024-03-10 DIAGNOSIS — Z79899 Other long term (current) drug therapy: Secondary | ICD-10-CM | POA: Diagnosis not present

## 2024-03-10 DIAGNOSIS — E7800 Pure hypercholesterolemia, unspecified: Secondary | ICD-10-CM

## 2024-03-10 NOTE — Progress Notes (Signed)
 Subjective:     Patient ID: Angelica Alvarado, female    DOB: May 11, 1986, 38 y.o.   MRN: [id number]  Chief Complaint  Patient presents with   Medical Management of Chronic Issues    8 week f/u    HPI   History of Present Illness         She is here to follow up on severe obesity and taking Zepbound .  Reports not noticing a significant difference on the 2.5 mg dose for the first 2 weeks and thinks she may have gained additional weight initially since she was craving carbs.  Increase to the 5 mg dose yesterday.  Appetite is decreased now.  Eating fewer carbs and eating less and not as often.   Denies any significant side effects.  She is being more active. She has a garden   She joined [employer].  States she is aware [employer] has GLP-1 support and is considering reaching out.  Works with [facility]   Dr. Rigoberto at [facility]    Tdap UTD in 2017      Health Maintenance Due  Topic Date Due   Hepatitis C Screening  Never done   DTaP/Tdap/Td (1 - Tdap) Never done   Hepatitis B Vaccines (1 of 3 - 19+ 3-dose series) Never done   HPV VACCINES (1 - Risk 3-dose SCDM series) Never done   Cervical Cancer Screening (HPV/Pap Cotest)  Never done   COVID-19 Vaccine (3 - Mixed Product risk series) 05/25/2020    Past Medical History:  Diagnosis Date   Celiac disease    Polycystic ovarian disease    PONV (postoperative nausea and vomiting)     Past Surgical History:  Procedure Laterality Date   BIOPSY  10/12/2020   Procedure: BIOPSY;  Surgeon: Burnette Fallow, MD;  [facility];  Service: Endoscopy;;  EGD and COLON   CHOLECYSTECTOMY N/A 09/12/2023   Procedure: LAPAROSCOPIC CHOLECYSTECTOMY WITH ICG DYE;  Surgeon: Teresa Lonni HERO, MD;  Location: [facility];  Service: General;  Laterality: N/A;   COLONOSCOPY WITH PROPOFOL  N/A 10/12/2020   Procedure: COLONOSCOPY WITH PROPOFOL ;  Surgeon: Burnette Fallow, MD;  [facility]: [facility];  Service: Endoscopy;   Laterality: N/A;   ESOPHAGOGASTRODUODENOSCOPY (EGD) WITH PROPOFOL  N/A 10/12/2020   Procedure: ESOPHAGOGASTRODUODENOSCOPY (EGD) WITH PROPOFOL ;  Surgeon: Burnette Fallow, MD;  Location: [facility];  Service: Endoscopy;  Laterality: N/A;   LAPAROSCOPY Right 09/09/2015   Procedure: DIAGNOSTIC LAPAROSCOPY RIGHT CYST ASPIRATION AND CHROMOPERTUBATION;  Surgeon: Rosaline Cobble, MD;  Location: [facility];  Service: Gynecology;  Laterality: Right;   LEEP     TONSILLECTOMY     tubes in ears       Family History  Problem Relation Age of Onset   Breast cancer Paternal Grandmother    Cancer Paternal Grandmother    Hyperlipidemia Paternal Grandmother    Hypertension Mother    Obesity Mother    Diabetes Father    Hypertension Father    ADD / ADHD Sister     Social History   Socioeconomic History   Marital status: Married    Spouse name: Not on file   Number of children: Not on file   Years of education: Not on file   Highest education level: Some college, no degree  Occupational History   Not on file  Tobacco Use   Smoking status: Former    Current packs/day: 0.00    Types: Cigarettes    Quit date: 02/21/2018    Years since  quitting: 6.0   Smokeless tobacco: Never  Substance and Sexual Activity   Alcohol use: Not Currently    Comment: occ   Drug use: No   Sexual activity: Yes    Birth control/protection: None  Other Topics Concern   Not on file  Social History Narrative   Not on file   Social Drivers of Health   Financial Resource Strain: Low Risk  (03/09/2024)   Overall Financial Resource Strain (CARDIA)    Difficulty of Paying Living Expenses: Not very hard  Food Insecurity: No Food Insecurity (03/09/2024)   Hunger Vital Sign    Worried About Running Out of Food in the Last Year: Never true    Ran Out of Food in the Last Year: Never true  Transportation Needs: No Transportation Needs (03/09/2024)   PRAPARE - Administrator, Civil Service (Medical): No    Lack of  Transportation (Non-Medical): No  Physical Activity: Insufficiently Active (03/09/2024)   Exercise Vital Sign    Days of Exercise per Week: 3 days    Minutes of Exercise per Session: 20 min  Stress: Stress Concern Present (03/09/2024)   [employer] of Occupational Health - Occupational Stress Questionnaire    Feeling of Stress: To some extent  Social Connections: Socially Isolated (03/09/2024)   Social Connection and Isolation Panel    Frequency of Communication with Friends and Family: Once a week    Frequency of Social Gatherings with Friends and Family: Once a week    Attends Religious Services: Never    Database administrator or Organizations: No    Attends Engineer, structural: Not on file    Marital Status: Married  Catering manager Violence: Not At Risk (09/12/2023)   Humiliation, Afraid, Rape, and Kick questionnaire    Fear of Current or Ex-Partner: No    Emotionally Abused: No    Physically Abused: No    Sexually Abused: No    Outpatient Medications Prior to Visit  Medication Sig Dispense Refill   acetaminophen  (TYLENOL ) 500 MG tablet Take 500 mg by mouth every 6 (six) hours as needed (pain).     Cholecalciferol (VITAMIN D -3) 125 MCG (5000 UT) TABS Take 5,000 Units by mouth every evening.     clobetasol cream (TEMOVATE) 0.05 % Apply 1 Application topically daily as needed (hand rash).     Multiple Vitamin (MULTIVITAMIN WITH MINERALS) TABS tablet Take 1 tablet by mouth every evening.     nystatin-triamcinolone (MYCOLOG II) cream Apply 1 Application topically daily as needed (for rash).     polyethylene glycol (MIRALAX  / GLYCOLAX ) 17 g packet Take 17 g by mouth daily as needed for mild constipation.     tirzepatide  (ZEPBOUND ) 5 MG/0.5ML Pen Inject 5 mg into the skin once a week. 2 mL 3   No facility-administered medications prior to visit.    Allergies  Allergen Reactions   Gluten Meal Other (See Comments)    Review of Systems  Constitutional:  Negative  for chills, fever, malaise/fatigue and weight loss.  Respiratory:  Negative for shortness of breath.   Cardiovascular:  Negative for chest pain, palpitations and leg swelling.  Gastrointestinal:  Negative for abdominal pain, constipation, diarrhea, nausea and vomiting.  Genitourinary:  Negative for dysuria, frequency and urgency.  Neurological:  Negative for dizziness, focal weakness and headaches.  Psychiatric/Behavioral:  Negative for depression. The patient is not nervous/anxious.        Objective:    Physical Exam Constitutional:  General: She is not in acute distress.    Appearance: She is not ill-appearing.  Eyes:     Extraocular Movements: Extraocular movements intact.     Conjunctiva/sclera: Conjunctivae normal.  Cardiovascular:     Rate and Rhythm: Normal rate.  Pulmonary:     Effort: Pulmonary effort is normal.  Musculoskeletal:     Cervical back: Normal range of motion and neck supple.  Skin:    General: Skin is warm and dry.  Neurological:     General: No focal deficit present.     Mental Status: She is alert and oriented to person, place, and time.     Motor: No weakness.     Coordination: Coordination normal.     Gait: Gait normal.  Psychiatric:        Mood and Affect: Mood normal.        Behavior: Behavior normal.        Thought Content: Thought content normal.      BP 126/82 (BP Location: Left Arm, Patient Position: Sitting)   Pulse 78   Temp 97.8 F (36.6 C) (Temporal)   Ht 5' 3 (1.6 m)   Wt (!) 377 lb (171 kg)   SpO2 98%   BMI 66.78 kg/m  Wt Readings from Last 3 Encounters:  03/10/24 (!) 377 lb (171 kg)  01/14/24 (!) 375 lb (170.1 kg)  09/12/23 (!) 341 lb (154.7 kg)       Assessment & Plan:   Problem List Items Addressed This Visit     Severe obesity (BMI >= 40) (HCC) - Primary   Other Visit Diagnoses       Medication management         Pure hypercholesterolemia          Reviewed labs with patient.  Normal labs except for  mildly elevated LDL. Doing well on Zepbound .  She did not notice any significant change in appetite the first 2 weeks but now is experiencing decreased cravings and appetite.  She will continue 5 mg for the next 4 weeks at least.  She will let me know when she is ready to increase the dose.  Encouraged her to be involved in [facility] including the GLP-1 support they offer. Continue being active. Follow-up in 2 to 3 months or sooner if needed.  I am having Angelica Alvarado maintain her multivitamin with minerals, Vitamin D -3, acetaminophen , nystatin-triamcinolone, polyethylene glycol, clobetasol cream, and Zepbound .  No orders of the defined types were placed in this encounter.

## 2024-03-17 ENCOUNTER — Ambulatory Visit: Admitting: Family Medicine

## 2024-05-04 ENCOUNTER — Encounter: Payer: Self-pay | Admitting: Family Medicine

## 2024-05-04 NOTE — Telephone Encounter (Signed)
 Ok to increase dose

## 2024-05-05 ENCOUNTER — Other Ambulatory Visit (HOSPITAL_COMMUNITY): Payer: Self-pay

## 2024-05-05 MED ORDER — ZEPBOUND 7.5 MG/0.5ML ~~LOC~~ SOAJ
7.5000 mg | SUBCUTANEOUS | 1 refills | Status: DC
  Filled 2024-05-05: qty 2, 28d supply, fill #0

## 2024-05-05 MED ORDER — ZEPBOUND 7.5 MG/0.5ML ~~LOC~~ SOAJ: 7.5000 mg | SUBCUTANEOUS | 1 refills | Status: DC

## 2024-05-05 NOTE — Telephone Encounter (Signed)
 This is for new dosage, 7.5 mg. Does that still apply and not matter if dose has been changed?

## 2024-05-05 NOTE — Telephone Encounter (Signed)
 Please initiate PA for new dosage

## 2024-05-05 NOTE — Addendum Note (Signed)
 Addended by: Eithel Ryall E on: 05/05/2024 09:20 AM   Modules accepted: Orders

## 2024-05-05 NOTE — Addendum Note (Signed)
 Addended by: Harumi Yamin E on: 05/05/2024 09:30 AM   Modules accepted: Orders

## 2024-05-28 ENCOUNTER — Ambulatory Visit (INDEPENDENT_AMBULATORY_CARE_PROVIDER_SITE_OTHER): Admitting: Family Medicine

## 2024-05-28 ENCOUNTER — Encounter: Payer: Self-pay | Admitting: Family Medicine

## 2024-05-28 VITALS — BP 110/70 | HR 90 | Temp 97.5°F | Ht 63.0 in | Wt 363.0 lb

## 2024-05-28 DIAGNOSIS — M79605 Pain in left leg: Secondary | ICD-10-CM

## 2024-05-28 DIAGNOSIS — M79604 Pain in right leg: Secondary | ICD-10-CM

## 2024-05-28 DIAGNOSIS — Z0001 Encounter for general adult medical examination with abnormal findings: Secondary | ICD-10-CM

## 2024-05-28 DIAGNOSIS — R002 Palpitations: Secondary | ICD-10-CM

## 2024-05-28 DIAGNOSIS — I83899 Varicose veins of unspecified lower extremities with other complications: Secondary | ICD-10-CM | POA: Diagnosis not present

## 2024-05-28 DIAGNOSIS — E7800 Pure hypercholesterolemia, unspecified: Secondary | ICD-10-CM

## 2024-05-28 NOTE — Progress Notes (Signed)
 Complete physical exam  Patient: Angelica Alvarado   DOB: 1985/09/19   38 y.o. Female  MRN: [id number]  Subjective:    Chief Complaint  Patient presents with   Medical Management of Chronic Issues    CPE-c/o painful varicose veins both legs   She is here for a complete physical exam.   Discussed the use of AI scribe software for clinical note transcription with the patient, who gave verbal consent to proceed.  History of Present Illness Angelica Alvarado is a 38 year old female who presents for her annual physical exam.  Varicose veins and lower extremity pain - Varicose veins present in the legs - Intermittent pain associated with varicose veins - Concern about increased risk of blood clots  Anxiety and palpitations - Experiences anxiety, particularly at night - Concerned about distance from a hospital - Palpitations described as heart 'flip flopping' - Occasional shoulder pain attributed to anxiety  Elevated liver enzymes - History of elevated liver enzymes - Previous evaluation by gastroenterology, including hepatitis C screening  Tobacco use - Quit smoking on February 11, 2018 - Previously smoked approximately ten cigarettes per day since age 75    Health Maintenance  Topic Date Due   Hepatitis B Vaccine (1 of 3 - 19+ 3-dose series) Never done   HPV Vaccine (1 - Risk 3-dose SCDM series) Never done   DTaP/Tdap/Td vaccine (1 - Tdap) 10/27/2015   COVID-19 Vaccine (3 - Mixed Product risk series) 05/25/2020   Flu Shot  Never done   Pap with HPV screening  08/11/2026   Hepatitis C Screening  Completed   HIV Screening  Completed   Pneumococcal Vaccine  Aged Out   Meningitis B Vaccine  Aged Out    Wears seatbelt always, uses sunscreen, smoke detectors in home and functioning, does not text while driving, feels safe in home environment.  Depression screening:    01/14/2024   10:41 AM  Depression screen PHQ 2/9  Decreased Interest 1  Down, Depressed, Hopeless 0   PHQ - 2 Score 1  Altered sleeping 2  Tired, decreased energy 1  Change in appetite 1  Feeling bad or failure about yourself  0  Trouble concentrating 2  Moving slowly or fidgety/restless 0  Suicidal thoughts 0  PHQ-9 Score 7  Difficult doing work/chores Somewhat difficult   Anxiety Screening:     No data to display          Vision:Within last year and Dental: No current dental problems and Receives regular dental care  Patient Active Problem List   Diagnosis Date Noted   Severe obesity (BMI >= 40) (HCC) 01/14/2024   Celiac disease 02/22/2021   PCOS (polycystic ovarian syndrome) 01/13/2003   Past Medical History:  Diagnosis Date   Celiac disease    Polycystic ovarian disease    PONV (postoperative nausea and vomiting)    Past Surgical History:  Procedure Laterality Date   BIOPSY  10/12/2020   Procedure: BIOPSY;  Surgeon: Burnette Fallow, MD;  Location: [facility];  Service: Endoscopy;;  EGD and COLON   CHOLECYSTECTOMY N/A 09/12/2023   Procedure: LAPAROSCOPIC CHOLECYSTECTOMY WITH ICG DYE;  Surgeon: Teresa Lonni HERO, MD;  Location: [facility];  Service: General;  Laterality: N/A;   COLONOSCOPY WITH PROPOFOL  N/A 10/12/2020   Procedure: COLONOSCOPY WITH PROPOFOL ;  Surgeon: Burnette Fallow, MD;  Location: [facility];  Service: Endoscopy;  Laterality: N/A;   ESOPHAGOGASTRODUODENOSCOPY (EGD) WITH PROPOFOL  N/A 10/12/2020   Procedure: ESOPHAGOGASTRODUODENOSCOPY (EGD) WITH PROPOFOL ;  Surgeon: Burnette Fallow, MD;  Location: [facility];  Service: Endoscopy;  Laterality: N/A;   LAPAROSCOPY Right 09/09/2015   Procedure: DIAGNOSTIC LAPAROSCOPY RIGHT CYST ASPIRATION AND CHROMOPERTUBATION;  Surgeon: Rosaline Cobble, MD;  Location: [facility];  Service: Gynecology;  Laterality: Right;   LEEP     TONSILLECTOMY     tubes in ears      Social History   Tobacco Use   Smoking status: Former    Current packs/day: 0.00    Types: Cigarettes    Quit date: 02/21/2018    Years since quitting:  6.2   Smokeless tobacco: Never  Substance Use Topics   Alcohol use: Not Currently    Comment: occ   Drug use: No      Patient Care Team: Lendia Boby CROME, NP-C as PCP - General (Family Medicine) Shaaron Lamar HERO, MD as Consulting Physician (Gastroenterology)   Outpatient Medications Prior to Visit  Medication Sig   acetaminophen  (TYLENOL ) 500 MG tablet Take 500 mg by mouth every 6 (six) hours as needed (pain).   Cholecalciferol (VITAMIN D -3) 125 MCG (5000 UT) TABS Take 5,000 Units by mouth every evening.   clobetasol cream (TEMOVATE) 0.05 % Apply 1 Application topically daily as needed (hand rash).   Multiple Vitamin (MULTIVITAMIN WITH MINERALS) TABS tablet Take 1 tablet by mouth every evening.   nystatin-triamcinolone (MYCOLOG II) cream Apply 1 Application topically daily as needed (for rash).   polyethylene glycol (MIRALAX  / GLYCOLAX ) 17 g packet Take 17 g by mouth daily as needed for mild constipation.   tirzepatide  (ZEPBOUND ) 7.5 MG/0.5ML Pen Inject 7.5 mg into the skin once a week.   No facility-administered medications prior to visit.    Review of Systems  Constitutional:  Positive for weight loss. Negative for chills and fever.       Intentional   HENT:  Negative for congestion, ear pain, sinus pain and sore throat.   Eyes:  Negative for blurred vision, double vision and pain.  Respiratory:  Negative for cough, shortness of breath and wheezing.   Cardiovascular:  Positive for palpitations. Negative for chest pain and leg swelling.  Gastrointestinal:  Negative for abdominal pain, constipation, diarrhea, nausea and vomiting.  Genitourinary:  Negative for dysuria, frequency and urgency.  Musculoskeletal:  Positive for myalgias. Negative for back pain and joint pain.       Bilateral leg pain  Skin:  Negative for rash.  Neurological:  Negative for dizziness, tingling, focal weakness and headaches.  Psychiatric/Behavioral:  Negative for depression. The patient is not  nervous/anxious.        Objective:    BP 110/70 (BP Location: Left Arm, Patient Position: Sitting)   Pulse 90   Temp (!) 97.5 F (36.4 C) (Temporal)   Ht 5' 3 (1.6 m)   Wt (!) 363 lb (164.7 kg)   SpO2 99%   BMI 64.30 kg/m  BP Readings from Last 3 Encounters:  05/28/24 110/70  03/10/24 126/82  01/14/24 116/84   Wt Readings from Last 3 Encounters:  05/28/24 (!) 363 lb (164.7 kg)  03/10/24 (!) 377 lb (171 kg)  01/14/24 (!) 375 lb (170.1 kg)    Physical Exam Constitutional:      General: She is not in acute distress.    Appearance: She is obese. She is not ill-appearing.  HENT:     Right Ear: Tympanic membrane, ear canal and external ear normal.     Left Ear: Tympanic membrane, ear canal and external ear normal.  Nose: Nose normal.     Mouth/Throat:     Mouth: Mucous membranes are moist.     Pharynx: Oropharynx is clear.  Eyes:     Extraocular Movements: Extraocular movements intact.     Conjunctiva/sclera: Conjunctivae normal.     Pupils: Pupils are equal, round, and reactive to light.  Neck:     Thyroid : No thyroid  mass, thyromegaly or thyroid  tenderness.  Cardiovascular:     Rate and Rhythm: Normal rate and regular rhythm.     Pulses: Normal pulses.     Heart sounds: Normal heart sounds.  Pulmonary:     Effort: Pulmonary effort is normal.     Breath sounds: Normal breath sounds.  Abdominal:     General: Bowel sounds are normal.     Palpations: Abdomen is soft.     Tenderness: There is no abdominal tenderness. There is no right CVA tenderness, left CVA tenderness, guarding or rebound.  Musculoskeletal:        General: Normal range of motion.     Cervical back: Normal range of motion and neck supple. No tenderness.     Right lower leg: No edema.     Left lower leg: No edema.  Lymphadenopathy:     Cervical: No cervical adenopathy.  Skin:    General: Skin is warm and dry.     Findings: No lesion or rash.  Neurological:     General: No focal deficit  present.     Mental Status: She is alert and oriented to person, place, and time.     Cranial Nerves: No cranial nerve deficit.     Sensory: No sensory deficit.     Motor: No weakness.     Gait: Gait normal.  Psychiatric:        Mood and Affect: Mood normal.        Behavior: Behavior normal.        Thought Content: Thought content normal.      Results for orders placed or performed in visit on 05/28/24  HM HEPATITIS C SCREENING LAB  Result Value Ref Range   HM Hepatitis Screen Negative-Validated   HM PAP SMEAR  Result Value Ref Range   HM Pap smear negative       Assessment & Plan:    Routine Health Maintenance and Physical Exam Problem List Items Addressed This Visit     Severe obesity (BMI >= 40) (HCC)   Relevant Orders   CBC with Differential/Platelet   Comprehensive metabolic panel with GFR   TSH   T4, free   Other Visit Diagnoses       Encounter for general adult medical examination with abnormal findings    -  Primary     Pure hypercholesterolemia         Palpitations       Relevant Orders   CBC with Differential/Platelet   Comprehensive metabolic panel with GFR   TSH   T4, free     Bilateral leg pain       Relevant Orders   Ambulatory referral to Vascular Surgery     Symptomatic spider varicose vein       Relevant Orders   Ambulatory referral to Vascular Surgery       Assessment and Plan Assessment & Plan Adult Wellness Visit Annual physical exam conducted. She is up to date with OB GYN visits and TDAP vaccination. Hepatitis C screening completed previously. Discussed the importance of HPV vaccination with her OB GYN. COVID booster  and flu shot discussed but not administered today. - Perform blood work including CBC, electrolytes, kidney and liver function tests - Consider COVID booster at pharmacy if desired  Severe obesity (BMI = 40) She is on tirzepatide  and reports feeling in control with current dosage. - Continue current dosage of  tirzepatide   Pure hypercholesterolemia LDL cholesterol previously measured at 129 mg/dL, improved to 884 mg/dL as of August 72uy. No need to recheck today due to recent improvement and lack of concerning family history.  Varicose veins of lower extremities She reports painful varicose veins with concern for potential blood clots. - Examine legs for varicose veins and assess for signs of thrombosis - Referral to [facility]  Palpitations and anxiety She reports palpitations and anxiety, particularly at night. No strong family history of heart disease. Recent LDL and A1c levels are not concerning. Anxiety likely contributing to symptoms.  - Order EKG   EKG shows NSR, rate 65, no ischemic pattern      Return in about 6 months (around 11/26/2024) for chronic health conditions.     Boby Mackintosh, NP-C

## 2024-05-28 NOTE — Addendum Note (Signed)
 Addended by: Jyla Hopf E on: 05/28/2024 08:56 AM   Modules accepted: Orders

## 2024-05-29 LAB — CBC WITH DIFFERENTIAL/PLATELET
Basophils Absolute: 0.1 x10E3/uL (ref 0.0–0.2)
Basos: 1 %
EOS (ABSOLUTE): 0.3 x10E3/uL (ref 0.0–0.4)
Eos: 4 %
Hematocrit: 44.4 % (ref 34.0–46.6)
Hemoglobin: 13.9 g/dL (ref 11.1–15.9)
Immature Grans (Abs): 0 x10E3/uL (ref 0.0–0.1)
Immature Granulocytes: 0 %
Lymphocytes Absolute: 2 x10E3/uL (ref 0.7–3.1)
Lymphs: 27 %
MCH: 28.3 pg (ref 26.6–33.0)
MCHC: 31.3 g/dL — ABNORMAL LOW (ref 31.5–35.7)
MCV: 90 fL (ref 79–97)
Monocytes Absolute: 0.4 x10E3/uL (ref 0.1–0.9)
Monocytes: 6 %
Neutrophils Absolute: 4.6 x10E3/uL (ref 1.4–7.0)
Neutrophils: 62 %
Platelets: 268 x10E3/uL (ref 150–450)
RBC: 4.92 x10E6/uL (ref 3.77–5.28)
RDW: 14.4 % (ref 11.7–15.4)
WBC: 7.3 x10E3/uL (ref 3.4–10.8)

## 2024-05-29 LAB — COMPREHENSIVE METABOLIC PANEL WITH GFR
ALT: 18 IU/L (ref 0–32)
AST: 20 IU/L (ref 0–40)
Albumin: 4.4 g/dL (ref 3.9–4.9)
Alkaline Phosphatase: 70 IU/L (ref 41–116)
BUN/Creatinine Ratio: 15 (ref 9–23)
BUN: 11 mg/dL (ref 6–20)
Bilirubin Total: 0.3 mg/dL (ref 0.0–1.2)
CO2: 22 mmol/L (ref 20–29)
Calcium: 9.5 mg/dL (ref 8.7–10.2)
Chloride: 103 mmol/L (ref 96–106)
Creatinine, Ser: 0.73 mg/dL (ref 0.57–1.00)
Globulin, Total: 2.6 g/dL (ref 1.5–4.5)
Glucose: 80 mg/dL (ref 70–99)
Potassium: 5.1 mmol/L (ref 3.5–5.2)
Sodium: 139 mmol/L (ref 134–144)
Total Protein: 7 g/dL (ref 6.0–8.5)
eGFR: 108 mL/min/1.73 (ref >59)

## 2024-05-29 LAB — T4, FREE: Free T4: 1.04 ng/dL (ref 0.82–1.77)

## 2024-05-29 LAB — TSH: TSH: 1.65 u[IU]/mL (ref 0.450–4.500)

## 2024-05-29 NOTE — Addendum Note (Signed)
 Addended by: Jolynne Spurgin E on: 05/29/2024 07:15 AM   Modules accepted: Orders

## 2024-06-01 ENCOUNTER — Ambulatory Visit: Payer: Self-pay | Admitting: Family Medicine

## 2024-06-17 ENCOUNTER — Ambulatory Visit: Admitting: Family Medicine

## 2024-06-30 ENCOUNTER — Other Ambulatory Visit: Payer: Self-pay | Admitting: Family Medicine

## 2024-07-10 ENCOUNTER — Other Ambulatory Visit (HOSPITAL_COMMUNITY): Payer: Self-pay

## 2024-09-18 ENCOUNTER — Other Ambulatory Visit: Payer: Self-pay

## 2024-09-18 DIAGNOSIS — I872 Venous insufficiency (chronic) (peripheral): Secondary | ICD-10-CM

## 2024-09-21 ENCOUNTER — Encounter: Payer: Self-pay | Admitting: Family Medicine

## 2024-09-22 ENCOUNTER — Other Ambulatory Visit: Payer: Self-pay | Admitting: Family Medicine

## 2024-09-22 ENCOUNTER — Other Ambulatory Visit (HOSPITAL_COMMUNITY): Payer: Self-pay

## 2024-09-22 MED ORDER — ZEPBOUND 10 MG/0.5ML ~~LOC~~ SOAJ
10.0000 mg | SUBCUTANEOUS | 1 refills | Status: DC
  Filled 2024-09-22: qty 6, 84d supply, fill #0

## 2024-09-22 MED ORDER — ZEPBOUND 10 MG/0.5ML ~~LOC~~ SOAJ: 10.0000 mg | SUBCUTANEOUS | 1 refills | Status: AC

## 2024-10-13 ENCOUNTER — Ambulatory Visit (HOSPITAL_COMMUNITY): Payer: Self-pay

## 2024-10-13 ENCOUNTER — Ambulatory Visit (HOSPITAL_COMMUNITY): Payer: Self-pay | Admitting: Vascular Surgery

## 2024-11-26 ENCOUNTER — Ambulatory Visit: Admitting: Family Medicine
